# Patient Record
Sex: Male | Born: 2000 | Race: Black or African American | Hispanic: No | Marital: Single | Smoking: Current some day smoker
Health system: Southern US, Community
[De-identification: ages and names within clinical notes are randomized; demographics above are authoritative.]

## PROBLEM LIST (undated history)

## (undated) DIAGNOSIS — K4090 Unilateral inguinal hernia, without obstruction or gangrene, not specified as recurrent: Secondary | ICD-10-CM

## (undated) HISTORY — DX: Unilateral inguinal hernia, without obstruction or gangrene, not specified as recurrent: K40.90

## (undated) HISTORY — PX: NO PAST SURGERIES: SHX2092

## (undated) HISTORY — PX: WISDOM TOOTH EXTRACTION: SHX21

---
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
## 2019-07-15 ENCOUNTER — Ambulatory Visit: Payer: Medicaid Other | Admitting: Adult Health

## 2019-07-15 NOTE — Progress Notes (Deleted)
     Established patient visit   Patient: Travis Love   DOB: [birth year]   19 y.o. Male  MRN: [id number] Visit Date: 07/15/2019  Today's healthcare provider: Jairo Ben, FNP   No chief complaint on file.  Subjective    Exposure to STD This is a new problem.   ***  {Show patient history (optional):23778::" "}   Medications: Outpatient Medications Prior to Visit  Medication Sig  . cetirizine (ZYRTEC) 10 MG tablet Take 10 mg by mouth daily.  Marland Kitchen doxycycline (VIBRA-TABS) 100 MG tablet Take 1 tablet (100 mg total) by mouth 2 (two) times daily.  Marland Kitchen EPIPEN 2-PAK 0.3 MG/0.3ML SOAJ injection use as directed by prescriber  FOR SYSTEMIC REACTION INJECTION 4 DAYS  . fluticasone (FLONASE) 50 MCG/ACT nasal spray 1 spray daily.  Marland Kitchen ibuprofen (ADVIL,MOTRIN) 600 MG tablet Take 1 tablet (600 mg total) by mouth every 8 (eight) hours as needed.  . montelukast (SINGULAIR) 10 MG tablet Take 10 mg by mouth every evening.  Marland Kitchen omeprazole (PRILOSEC) 20 MG capsule Take 20 mg by mouth daily.  Marland Kitchen sulfacetamide (BLEPH-10) 10 % ophthalmic solution Place 2 drops into the left eye 4 (four) times daily.   No facility-administered medications prior to visit.    Review of Systems  {Heme  Chem  Endocrine  Serology  Results Review (optional):23779::" "}  Objective    There were no vitals taken for this visit. {Show previous vital signs (optional):23777::" "}  Physical Exam  ***  No results found for any visits on 07/15/19.  Assessment & Plan     ***  No follow-ups on file.      {provider attestation***:1}   Jairo Ben, FNP  [facility] [phone] (phone) [phone] (fax)  [facility]

## 2019-08-05 ENCOUNTER — Encounter: Payer: Self-pay | Admitting: Physician Assistant

## 2019-08-05 ENCOUNTER — Other Ambulatory Visit: Payer: Self-pay

## 2019-08-05 ENCOUNTER — Ambulatory Visit (INDEPENDENT_AMBULATORY_CARE_PROVIDER_SITE_OTHER): Payer: Medicaid Other | Admitting: Physician Assistant

## 2019-08-05 ENCOUNTER — Other Ambulatory Visit (HOSPITAL_COMMUNITY)
Admission: RE | Admit: 2019-08-05 | Discharge: 2019-08-05 | Disposition: A | Discharge status: Home / Self Care | Payer: Medicaid Other | Source: Ambulatory Visit | Attending: Physician Assistant | Admitting: Physician Assistant

## 2019-08-05 VITALS — BP 116/82 | HR 118 | Temp 97.1°F | Wt 132.4 lb

## 2019-08-05 DIAGNOSIS — Z202 Contact with and (suspected) exposure to infections with a predominantly sexual mode of transmission: Secondary | ICD-10-CM | POA: Insufficient documentation

## 2019-08-05 MED ORDER — AZITHROMYCIN 500 MG PO TABS: 1000.0000 mg | ORAL_TABLET | Freq: Every day | ORAL | 0 refills | Status: DC

## 2019-08-05 MED ORDER — CEFTRIAXONE SODIUM 500 MG IJ SOLR
500.0000 mg | Freq: Once | INTRAMUSCULAR | Status: AC
  Administered 2019-08-05: 500 mg via INTRAMUSCULAR

## 2019-08-05 NOTE — Progress Notes (Signed)
Established patient visit   Patient: Travis Love   DOB: 01/23/2001   19 y.o. Male  MRN: [id number] Visit Date: 08/05/2019  Today's healthcare provider: Trinna Post, Travis Love   Chief Complaint  Patient presents with  . Exposure to STD  Travis Love,Travis Love,Travis Travis a scribe for [employer], Travis Love.,Travis documented all relevant documentation on the behalf of Trinna Post, Travis Love,Travis directed by  Trinna Post, Travis Love while in the presence of Trinna Post, Travis Love.  Subjective    Exposure to STD The patient's pertinent negatives include no genital injury, genital itching, genital lesions, pelvic pain, penile discharge or testicular pain. The patient is experiencing no pain. Pertinent negatives include no discolored urine, hesitancy, nausea, painful intercourse, rash or urinary retention. Nothing aggravates the symptoms. He has tried nothing for the symptoms. The treatment provided no relief. He is sexually active. Yes, his partner has an STD. His past medical history is significant for chlamydia and gonorrhea.  Patient presents today for exposure to sexually transmitted diseases. Patient had sexually act with a male that tested positive for gonorrhea and chlamydia. Patient declines any symptoms at the moment.     Medications: Outpatient Medications Prior to Visit  Medication Sig  . cetirizine (ZYRTEC) 10 MG tablet Take 10 mg by mouth daily. (Patient not taking: Reported on 08/05/2019)  . doxycycline (VIBRA-TABS) 100 MG tablet Take 1 tablet (100 mg total) by mouth 2 (two) times daily. (Patient not taking: Reported on 08/05/2019)  . EPIPEN 2-PAK 0.3 MG/0.3ML SOAJ injection use Travis directed by prescriber  FOR SYSTEMIC REACTION INJECTION 4 DAYS (Patient not taking: Reported on 08/05/2019)  . fluticasone (FLONASE) 50 MCG/ACT nasal spray 1 spray daily. (Patient not taking: Reported on 08/05/2019)  . ibuprofen (ADVIL,MOTRIN) 600 MG tablet Take 1 tablet (600 mg total) by mouth every 8  (eight) hours Travis needed. (Patient not taking: Reported on 08/05/2019)  . montelukast (SINGULAIR) 10 MG tablet Take 10 mg by mouth every evening. (Patient not taking: Reported on 08/05/2019)  . omeprazole (PRILOSEC) 20 MG capsule Take 20 mg by mouth daily. (Patient not taking: Reported on 08/05/2019)  . sulfacetamide (BLEPH-10) 10 % ophthalmic solution Place 2 drops into the left eye 4 (four) times daily. (Patient not taking: Reported on 08/05/2019)   No facility-administered medications prior to visit.    Review of Systems  Gastrointestinal: Negative for nausea.  Genitourinary: Negative for discharge, hesitancy, pelvic pain and testicular pain.  Skin: Negative for rash.      Objective    BP 116/82 (BP Location: Right Arm, Patient Position: Sitting, Cuff Size: Normal)   Pulse (!) 118   Temp (!) 97.1 F (36.2 C) (Temporal)   Wt 132 lb 6.4 oz (60.1 kg)   SpO2 98%   BMI 20.74 kg/m    Physical Exam Constitutional:      Appearance: Normal appearance.  Cardiovascular:     Rate and Rhythm: Normal rate and regular rhythm.     Heart sounds: Normal heart sounds.  Pulmonary:     Effort: Pulmonary effort is normal.     Breath sounds: Normal breath sounds.  Skin:    General: Skin is warm and dry.  Neurological:     Mental Status: He is alert and oriented to person, place, and time. Mental status is at baseline.  Psychiatric:        Mood and Affect: Mood normal.        Behavior: Behavior normal.  No results found for any visits on 08/05/19.  Assessment & Plan    1. Exposure to sexually transmitted disease (STD) Patient sexually active with a male that tested positive for gonorrhea and chlamydia. Treat empirically.   - Urine cytology ancillary only - cefTRIAXone (ROCEPHIN) injection 500 mg - azithromycin (ZITHROMAX) 500 MG tablet; Take 2 tablets (1,000 mg total) by mouth daily.  Dispense: 2 tablet; Refill: 0   Return if symptoms worsen or fail to improve.      ITrey Sailors, Travis Love, Travis reviewed all documentation for this visit. The documentation on 08/05/19 for the exam, diagnosis, procedures, and orders are all accurate and complete.    Maryella Shivers  [facility] [phone] (phone) [phone] (fax)  [facility]

## 2019-08-09 ENCOUNTER — Telehealth: Payer: Self-pay

## 2019-08-09 LAB — URINE CYTOLOGY ANCILLARY ONLY
Chlamydia: POSITIVE — AB
Comment: NEGATIVE
Comment: NEGATIVE
Comment: NORMAL
Neisseria Gonorrhea: POSITIVE — AB
Trichomonas: NEGATIVE

## 2019-08-09 NOTE — Telephone Encounter (Signed)
Patient's mother Travis Love) was advised per patient's request when in office on 08/05/2019. Form also submitted to health department.

## 2019-08-09 NOTE — Telephone Encounter (Signed)
-----   Message from Margaretann Loveless, [place] sent at 08/09/2019 12:38 PM EDT ----- Urine was positive for gonorrhea and chlamydia. You have already been treated for this on 08/05/19. Can come back to the office in 2 weeks and test for cure. Staff, please send form to health department.

## 2019-08-18 NOTE — Progress Notes (Signed)
Established patient visit   Patient: Travis Love   DOB: [birth year]   19 y.o. Male  MRN: [id number] Visit Date: 08/19/2019  Today's healthcare provider: Trey Sailors, PA-C   Chief Complaint  Patient presents with  . Follow-up   Subjective    HPI Follow up for STI  The patient was last seen for this 2 weeks ago. Changes made at last visit include starting azithromycin 500mg  2 tablets in 1 day. He was also given Rocephin 500mg  injection in the office.        He reports good compliance with treatment. He feels that condition is Worse.  He is not having side effects.   Reports his sexual partner has trichomonas. He reports a small lump on his back for 2 weeks. He reports that initially it was painful, but it is not any longer. Denies fevers, chills, and night sweats.        Medications: Outpatient Medications Prior to Visit  Medication Sig  . azithromycin (ZITHROMAX) 500 MG tablet Take 2 tablets (1,000 mg total) by mouth daily. (Patient not taking: Reported on 08/19/2019)  . cetirizine (ZYRTEC) 10 MG tablet Take 10 mg by mouth daily. (Patient not taking: Reported on 08/05/2019)  . doxycycline (VIBRA-TABS) 100 MG tablet Take 1 tablet (100 mg total) by mouth 2 (two) times daily. (Patient not taking: Reported on 08/05/2019)  . EPIPEN 2-PAK 0.3 MG/0.3ML SOAJ injection use as directed by prescriber  FOR SYSTEMIC REACTION INJECTION 4 DAYS (Patient not taking: Reported on 08/05/2019)  . fluticasone (FLONASE) 50 MCG/ACT nasal spray 1 spray daily. (Patient not taking: Reported on 08/05/2019)  . ibuprofen (ADVIL,MOTRIN) 600 MG tablet Take 1 tablet (600 mg total) by mouth every 8 (eight) hours as needed. (Patient not taking: Reported on 08/05/2019)  . montelukast (SINGULAIR) 10 MG tablet Take 10 mg by mouth every evening. (Patient not taking: Reported on 08/05/2019)  . omeprazole (PRILOSEC) 20 MG capsule Take 20 mg by mouth daily. (Patient not taking: Reported on 08/05/2019)  .  sulfacetamide (BLEPH-10) 10 % ophthalmic solution Place 2 drops into the left eye 4 (four) times daily. (Patient not taking: Reported on 08/05/2019)   No facility-administered medications prior to visit.    Review of Systems    Objective    BP 120/72   Pulse 96   Temp 97.8 F (36.6 C)   Resp 16   Wt 138 lb (62.6 kg)   BMI 21.61 kg/m    Physical Exam Constitutional:      Appearance: Normal appearance.  Cardiovascular:     Rate and Rhythm: Normal rate and regular rhythm.     Heart sounds: Normal heart sounds.  Pulmonary:     Effort: Pulmonary effort is normal.     Breath sounds: Normal breath sounds.  Skin:      Neurological:     Mental Status: He is alert and oriented to person, place, and time. Mental status is at baseline.  Psychiatric:        Mood and Affect: Mood normal.       Results for orders placed or performed in visit on 08/19/19  CBC with Differential/Platelet  Result Value Ref Range   WBC 7.5 3.4 - 10.8 x10E3/uL   RBC 4.91 4.14 - 5.80 x10E6/uL   Hemoglobin 14.5 13.0 - 17.7 g/dL   Hematocrit 10/05/2019 08/21/19 - 51.0 %   MCV 88 79 - 97 fL   MCH 29.5 26.6 - 33.0 pg   MCHC 33.5  31 - 35 g/dL   RDW 12.2 11.6 - 15.4 %   Platelets 245 150 - 450 x10E3/uL   Neutrophils 60 Not Estab. %   Lymphs 30 Not Estab. %   Monocytes 8 Not Estab. %   Eos 1 Not Estab. %   Basos 1 Not Estab. %   Neutrophils Absolute 4.5 1 - 7 x10E3/uL   Lymphocytes Absolute 2.3 0 - 3 x10E3/uL   Monocytes Absolute 0.6 0 - 0 x10E3/uL   EOS (ABSOLUTE) 0.1 0.0 - 0.4 x10E3/uL   Basophils Absolute 0.0 0 - 0 x10E3/uL   Immature Granulocytes 0 Not Estab. %   Immature Grans (Abs) 0.0 0.0 - 0.1 x10E3/uL  Comprehensive metabolic panel  Result Value Ref Range   Glucose 67 65 - 99 mg/dL   BUN 9 6 - 20 mg/dL   Creatinine, Ser 1.01 0.76 - 1.27 mg/dL   GFR calc non Af Amer 107 >59 mL/min/1.73   GFR calc Af Amer 124 >59 mL/min/1.73   BUN/Creatinine Ratio 9 9 - 20   Sodium 139 134 - 144 mmol/L    Potassium 4.2 3.5 - 5.2 mmol/L   Chloride 100 96 - 106 mmol/L   CO2 26 20 - 29 mmol/L   Calcium 9.4 8.7 - 10.2 mg/dL   Total Protein 7.5 6.0 - 8.5 g/dL   Albumin 4.7 4.1 - 5.2 g/dL   Globulin, Total 2.8 1.5 - 4.5 g/dL   Albumin/Globulin Ratio 1.7 1.2 - 2.2   Bilirubin Total 0.3 0.0 - 1.2 mg/dL   Alkaline Phosphatase 118 55 - 125 IU/L   AST 18 0 - 40 IU/L   ALT 7 0 - 44 IU/L  HIV antibody (with reflex)  Result Value Ref Range   HIV Screen 4th Generation wRfx Non Reactive Non Reactive    Assessment & Plan    1. Exposure to sexually transmitted disease (STD) Recheck after treatment.  - HIV antibody (with reflex) - Urine cytology ancillary only  2. Abdominal mass, unspecified abdominal location Xray as below. If xray is negative, will get ultrasound.   Ordering ultrasound, xray normal. Ultrasound order placed.   - DG Ribs Unilateral Right; Future - CBC with Differential/Platelet - Comprehensive metabolic panel   Return if symptoms worsen or fail to improve.      ITrinna Post, PA-C, have reviewed all documentation for this visit. The documentation on 08/23/19 for the exam, diagnosis, procedures, and orders are all accurate and complete.    Paulene Floor  [facility] [phone] (phone) [phone] (fax)  [place]

## 2019-08-19 ENCOUNTER — Encounter: Payer: Self-pay | Admitting: Physician Assistant

## 2019-08-19 ENCOUNTER — Ambulatory Visit (INDEPENDENT_AMBULATORY_CARE_PROVIDER_SITE_OTHER): Payer: Medicaid Other | Admitting: Physician Assistant

## 2019-08-19 ENCOUNTER — Ambulatory Visit
Admission: RE | Admit: 2019-08-19 | Discharge: 2019-08-19 | Disposition: A | Discharge status: Home / Self Care | Payer: Medicaid Other | Attending: Physician Assistant | Admitting: Physician Assistant

## 2019-08-19 ENCOUNTER — Other Ambulatory Visit (HOSPITAL_COMMUNITY)
Admission: RE | Admit: 2019-08-19 | Discharge: 2019-08-19 | Disposition: A | Discharge status: Home / Self Care | Payer: Medicaid Other | Source: Ambulatory Visit | Attending: Physician Assistant | Admitting: Physician Assistant

## 2019-08-19 ENCOUNTER — Ambulatory Visit
Admission: RE | Admit: 2019-08-19 | Discharge: 2019-08-19 | Disposition: A | Discharge status: Home / Self Care | Payer: Medicaid Other | Source: Ambulatory Visit | Attending: Physician Assistant | Admitting: Physician Assistant

## 2019-08-19 ENCOUNTER — Other Ambulatory Visit: Payer: Self-pay

## 2019-08-19 VITALS — BP 120/72 | HR 96 | Temp 97.8°F | Resp 16 | Wt 138.0 lb

## 2019-08-19 DIAGNOSIS — Z202 Contact with and (suspected) exposure to infections with a predominantly sexual mode of transmission: Secondary | ICD-10-CM | POA: Insufficient documentation

## 2019-08-19 DIAGNOSIS — R0781 Pleurodynia: Secondary | ICD-10-CM | POA: Insufficient documentation

## 2019-08-19 DIAGNOSIS — R222 Localized swelling, mass and lump, trunk: Secondary | ICD-10-CM | POA: Diagnosis not present

## 2019-08-19 DIAGNOSIS — R1900 Intra-abdominal and pelvic swelling, mass and lump, unspecified site: Secondary | ICD-10-CM | POA: Insufficient documentation

## 2019-08-20 LAB — COMPREHENSIVE METABOLIC PANEL
ALT: 7 IU/L (ref 0–44)
AST: 18 IU/L (ref 0–40)
Albumin/Globulin Ratio: 1.7 (ref 1.2–2.2)
Albumin: 4.7 g/dL (ref 4.1–5.2)
Alkaline Phosphatase: 118 IU/L (ref 55–125)
BUN/Creatinine Ratio: 9 (ref 9–20)
BUN: 9 mg/dL (ref 6–20)
Bilirubin Total: 0.3 mg/dL (ref 0.0–1.2)
CO2: 26 mmol/L (ref 20–29)
Calcium: 9.4 mg/dL (ref 8.7–10.2)
Chloride: 100 mmol/L (ref 96–106)
Creatinine, Ser: 1.01 mg/dL (ref 0.76–1.27)
GFR calc Af Amer: 124 mL/min/{1.73_m2} (ref >59)
GFR calc non Af Amer: 107 mL/min/{1.73_m2} (ref >59)
Globulin, Total: 2.8 g/dL (ref 1.5–4.5)
Glucose: 67 mg/dL (ref 65–99)
Potassium: 4.2 mmol/L (ref 3.5–5.2)
Sodium: 139 mmol/L (ref 134–144)
Total Protein: 7.5 g/dL (ref 6.0–8.5)

## 2019-08-20 LAB — CBC WITH DIFFERENTIAL/PLATELET
Basophils Absolute: 0 10*3/uL (ref 0.0–0.2)
Basos: 1 %
EOS (ABSOLUTE): 0.1 10*3/uL (ref 0.0–0.4)
Eos: 1 %
Hematocrit: 43.3 % (ref 37.5–51.0)
Hemoglobin: 14.5 g/dL (ref 13.0–17.7)
Immature Grans (Abs): 0 10*3/uL (ref 0.0–0.1)
Immature Granulocytes: 0 %
Lymphocytes Absolute: 2.3 10*3/uL (ref 0.7–3.1)
Lymphs: 30 %
MCH: 29.5 pg (ref 26.6–33.0)
MCHC: 33.5 g/dL (ref 31.5–35.7)
MCV: 88 fL (ref 79–97)
Monocytes Absolute: 0.6 10*3/uL (ref 0.1–0.9)
Monocytes: 8 %
Neutrophils Absolute: 4.5 10*3/uL (ref 1.4–7.0)
Neutrophils: 60 %
Platelets: 245 10*3/uL (ref 150–450)
RBC: 4.91 x10E6/uL (ref 4.14–5.80)
RDW: 12.2 % (ref 11.6–15.4)
WBC: 7.5 10*3/uL (ref 3.4–10.8)

## 2019-08-20 LAB — HIV ANTIBODY (ROUTINE TESTING W REFLEX): HIV Screen 4th Generation wRfx: NONREACTIVE

## 2019-08-22 ENCOUNTER — Telehealth: Payer: Self-pay

## 2019-08-22 NOTE — Telephone Encounter (Signed)
Patients mother has been advised, please place in order for ultrasound. KW

## 2019-08-22 NOTE — Telephone Encounter (Signed)
-----   Message from Trey Sailors, [place] sent at 08/22/2019  1:48 PM EDT ----- Lump does not appear on xray. Is it OK to proceed with ultrasound? OK to talk to mom.

## 2019-08-23 LAB — URINE CYTOLOGY ANCILLARY ONLY
Chlamydia: NEGATIVE
Comment: NEGATIVE
Comment: NEGATIVE
Comment: NORMAL
Neisseria Gonorrhea: NEGATIVE
Trichomonas: NEGATIVE

## 2019-08-24 ENCOUNTER — Telehealth: Payer: Self-pay | Admitting: Physician Assistant

## 2019-08-24 ENCOUNTER — Other Ambulatory Visit: Payer: Self-pay | Admitting: Internal Medicine

## 2019-08-24 DIAGNOSIS — R1900 Intra-abdominal and pelvic swelling, mass and lump, unspecified site: Secondary | ICD-10-CM

## 2019-08-24 NOTE — Telephone Encounter (Signed)
Please review. Thanks!  

## 2019-08-24 NOTE — Telephone Encounter (Signed)
Order placed on 08/23/2019.

## 2019-08-24 NOTE — Telephone Encounter (Signed)
Per [facility] before biopsy can be done they will need an order for ultrasound chest soft tissue [id number]

## 2019-08-24 NOTE — Telephone Encounter (Signed)
They also ask that you put in all notes that you had in biopsy order

## 2019-08-24 NOTE — Telephone Encounter (Signed)
Can we check with sarah or the imaging center to see if there are recommendations to ultrasound his back where the lesion is? I don't want a biopsy but that was the only ultrasound that covered the area in question. The lesion is on his right posterior torso around the 10th rib.

## 2019-08-26 ENCOUNTER — Telehealth: Payer: Self-pay

## 2019-08-26 NOTE — Addendum Note (Signed)
Addended by: Trey Sailors on: 08/26/2019 09:35 AM   Modules accepted: Orders

## 2019-08-26 NOTE — Telephone Encounter (Signed)
Copied from CRM [id number]. Topic: General - Other >> Aug 26, 2019  4:12 PM Gwenlyn Fudge wrote: Reason for CRM: Pts mother returning call and is requesting a call back directly. Please advise.

## 2019-08-26 NOTE — Telephone Encounter (Signed)
Called ultrasound dept and spoke to Travis Love.  She said that if the area is above the bellybutton on his back we would need a [place] chest (even though its more towards the back). If it is below the bellybutton, it would be US abdomen limited. She says to copy and paste the description that is in the previous order.

## 2019-08-26 NOTE — Telephone Encounter (Signed)
New ultrasound ordered

## 2019-09-01 ENCOUNTER — Telehealth: Payer: Self-pay

## 2019-09-01 ENCOUNTER — Ambulatory Visit: Payer: Medicaid Other | Attending: Physician Assistant

## 2019-09-01 NOTE — Telephone Encounter (Signed)
Copied from CRM [id number]. Topic: General - Inquiry >> Sep 01, 2019 10:31 AM Leafy Ro wrote: Reason for CRM: stephanie with [facility] outpt imaging is calling to let adriana know the pt did not show up for his ultrasound today

## 2020-08-31 DIAGNOSIS — F602 Antisocial personality disorder: Secondary | ICD-10-CM | POA: Diagnosis not present

## 2020-08-31 DIAGNOSIS — Z720 Tobacco use: Secondary | ICD-10-CM | POA: Diagnosis not present

## 2020-09-07 DIAGNOSIS — Z720 Tobacco use: Secondary | ICD-10-CM | POA: Diagnosis not present

## 2020-09-07 DIAGNOSIS — F602 Antisocial personality disorder: Secondary | ICD-10-CM | POA: Diagnosis not present

## 2020-09-21 DIAGNOSIS — Z720 Tobacco use: Secondary | ICD-10-CM | POA: Diagnosis not present

## 2020-09-21 DIAGNOSIS — F602 Antisocial personality disorder: Secondary | ICD-10-CM | POA: Diagnosis not present

## 2020-10-03 DIAGNOSIS — F602 Antisocial personality disorder: Secondary | ICD-10-CM | POA: Diagnosis not present

## 2020-10-03 DIAGNOSIS — Z720 Tobacco use: Secondary | ICD-10-CM | POA: Diagnosis not present

## 2020-10-04 DIAGNOSIS — Z720 Tobacco use: Secondary | ICD-10-CM | POA: Diagnosis not present

## 2020-10-04 DIAGNOSIS — F602 Antisocial personality disorder: Secondary | ICD-10-CM | POA: Diagnosis not present

## 2020-10-08 DIAGNOSIS — F602 Antisocial personality disorder: Secondary | ICD-10-CM | POA: Diagnosis not present

## 2020-10-08 DIAGNOSIS — Z720 Tobacco use: Secondary | ICD-10-CM | POA: Diagnosis not present

## 2020-10-10 DIAGNOSIS — Z720 Tobacco use: Secondary | ICD-10-CM | POA: Diagnosis not present

## 2020-10-10 DIAGNOSIS — F602 Antisocial personality disorder: Secondary | ICD-10-CM | POA: Diagnosis not present

## 2020-10-15 DIAGNOSIS — Z720 Tobacco use: Secondary | ICD-10-CM | POA: Diagnosis not present

## 2020-10-15 DIAGNOSIS — F602 Antisocial personality disorder: Secondary | ICD-10-CM | POA: Diagnosis not present

## 2020-10-19 DIAGNOSIS — F602 Antisocial personality disorder: Secondary | ICD-10-CM | POA: Diagnosis not present

## 2020-10-19 DIAGNOSIS — Z720 Tobacco use: Secondary | ICD-10-CM | POA: Diagnosis not present

## 2020-10-23 DIAGNOSIS — Z720 Tobacco use: Secondary | ICD-10-CM | POA: Diagnosis not present

## 2020-10-23 DIAGNOSIS — F602 Antisocial personality disorder: Secondary | ICD-10-CM | POA: Diagnosis not present

## 2020-11-06 DIAGNOSIS — F602 Antisocial personality disorder: Secondary | ICD-10-CM | POA: Diagnosis not present

## 2020-11-06 DIAGNOSIS — Z720 Tobacco use: Secondary | ICD-10-CM | POA: Diagnosis not present

## 2020-11-06 DIAGNOSIS — F1299 Cannabis use, unspecified with unspecified cannabis-induced disorder: Secondary | ICD-10-CM | POA: Diagnosis not present

## 2020-11-15 DIAGNOSIS — F602 Antisocial personality disorder: Secondary | ICD-10-CM | POA: Diagnosis not present

## 2020-11-15 DIAGNOSIS — F1299 Cannabis use, unspecified with unspecified cannabis-induced disorder: Secondary | ICD-10-CM | POA: Diagnosis not present

## 2020-11-15 DIAGNOSIS — Z720 Tobacco use: Secondary | ICD-10-CM | POA: Diagnosis not present

## 2020-11-21 DIAGNOSIS — F1299 Cannabis use, unspecified with unspecified cannabis-induced disorder: Secondary | ICD-10-CM | POA: Diagnosis not present

## 2020-11-21 DIAGNOSIS — Z720 Tobacco use: Secondary | ICD-10-CM | POA: Diagnosis not present

## 2020-11-21 DIAGNOSIS — F602 Antisocial personality disorder: Secondary | ICD-10-CM | POA: Diagnosis not present

## 2020-11-26 DIAGNOSIS — F1299 Cannabis use, unspecified with unspecified cannabis-induced disorder: Secondary | ICD-10-CM | POA: Diagnosis not present

## 2020-11-26 DIAGNOSIS — F602 Antisocial personality disorder: Secondary | ICD-10-CM | POA: Diagnosis not present

## 2020-11-26 DIAGNOSIS — Z720 Tobacco use: Secondary | ICD-10-CM | POA: Diagnosis not present

## 2020-11-27 ENCOUNTER — Ambulatory Visit
Admission: EM | Admit: 2020-11-27 | Discharge: 2020-11-27 | Disposition: A | Discharge status: Home / Self Care | Payer: Medicaid Other | Attending: Emergency Medicine | Admitting: Emergency Medicine

## 2020-11-27 ENCOUNTER — Other Ambulatory Visit: Payer: Self-pay

## 2020-11-27 ENCOUNTER — Encounter: Payer: Self-pay | Admitting: Emergency Medicine

## 2020-11-27 ENCOUNTER — Ambulatory Visit: Payer: Self-pay | Admitting: *Deleted

## 2020-11-27 DIAGNOSIS — M25552 Pain in left hip: Secondary | ICD-10-CM | POA: Diagnosis not present

## 2020-11-27 MED ORDER — IBUPROFEN 600 MG PO TABS: 600.0000 mg | ORAL_TABLET | Freq: Four times a day (QID) | ORAL | 0 refills | Status: DC | PRN
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
## 2020-11-27 NOTE — Telephone Encounter (Signed)
Returned call to [place]. Advised UC due to no availability. Expressed understanding.
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
## 2020-11-27 NOTE — Discharge Instructions (Addendum)
Take the ibuprofen as needed for discomfort.    Follow up with your primary care provider if your symptoms are not improving.

## 2020-12-05 DIAGNOSIS — F1299 Cannabis use, unspecified with unspecified cannabis-induced disorder: Secondary | ICD-10-CM | POA: Diagnosis not present

## 2020-12-05 DIAGNOSIS — Z720 Tobacco use: Secondary | ICD-10-CM | POA: Diagnosis not present

## 2020-12-05 DIAGNOSIS — F602 Antisocial personality disorder: Secondary | ICD-10-CM | POA: Diagnosis not present

## 2020-12-06 DIAGNOSIS — F1299 Cannabis use, unspecified with unspecified cannabis-induced disorder: Secondary | ICD-10-CM | POA: Diagnosis not present

## 2020-12-06 DIAGNOSIS — Z720 Tobacco use: Secondary | ICD-10-CM | POA: Diagnosis not present

## 2020-12-06 DIAGNOSIS — F602 Antisocial personality disorder: Secondary | ICD-10-CM | POA: Diagnosis not present

## 2020-12-10 DIAGNOSIS — F602 Antisocial personality disorder: Secondary | ICD-10-CM | POA: Diagnosis not present

## 2020-12-10 DIAGNOSIS — F1299 Cannabis use, unspecified with unspecified cannabis-induced disorder: Secondary | ICD-10-CM | POA: Diagnosis not present

## 2020-12-10 DIAGNOSIS — Z720 Tobacco use: Secondary | ICD-10-CM | POA: Diagnosis not present

## 2020-12-17 DIAGNOSIS — Z720 Tobacco use: Secondary | ICD-10-CM | POA: Diagnosis not present

## 2020-12-17 DIAGNOSIS — F1299 Cannabis use, unspecified with unspecified cannabis-induced disorder: Secondary | ICD-10-CM | POA: Diagnosis not present

## 2020-12-17 DIAGNOSIS — F602 Antisocial personality disorder: Secondary | ICD-10-CM | POA: Diagnosis not present

## 2020-12-18 DIAGNOSIS — Z720 Tobacco use: Secondary | ICD-10-CM | POA: Diagnosis not present

## 2020-12-18 DIAGNOSIS — F1299 Cannabis use, unspecified with unspecified cannabis-induced disorder: Secondary | ICD-10-CM | POA: Diagnosis not present

## 2020-12-18 DIAGNOSIS — F602 Antisocial personality disorder: Secondary | ICD-10-CM | POA: Diagnosis not present

## 2020-12-20 DIAGNOSIS — Z720 Tobacco use: Secondary | ICD-10-CM | POA: Diagnosis not present

## 2020-12-20 DIAGNOSIS — F1299 Cannabis use, unspecified with unspecified cannabis-induced disorder: Secondary | ICD-10-CM | POA: Diagnosis not present

## 2020-12-20 DIAGNOSIS — F602 Antisocial personality disorder: Secondary | ICD-10-CM | POA: Diagnosis not present

## 2020-12-25 DIAGNOSIS — Z720 Tobacco use: Secondary | ICD-10-CM | POA: Diagnosis not present

## 2020-12-25 DIAGNOSIS — F1299 Cannabis use, unspecified with unspecified cannabis-induced disorder: Secondary | ICD-10-CM | POA: Diagnosis not present

## 2020-12-25 DIAGNOSIS — F602 Antisocial personality disorder: Secondary | ICD-10-CM | POA: Diagnosis not present

## 2020-12-27 DIAGNOSIS — Z720 Tobacco use: Secondary | ICD-10-CM | POA: Diagnosis not present

## 2020-12-27 DIAGNOSIS — F602 Antisocial personality disorder: Secondary | ICD-10-CM | POA: Diagnosis not present

## 2020-12-27 DIAGNOSIS — F1299 Cannabis use, unspecified with unspecified cannabis-induced disorder: Secondary | ICD-10-CM | POA: Diagnosis not present

## 2020-12-31 DIAGNOSIS — F1299 Cannabis use, unspecified with unspecified cannabis-induced disorder: Secondary | ICD-10-CM | POA: Diagnosis not present

## 2020-12-31 DIAGNOSIS — Z720 Tobacco use: Secondary | ICD-10-CM | POA: Diagnosis not present

## 2020-12-31 DIAGNOSIS — F602 Antisocial personality disorder: Secondary | ICD-10-CM | POA: Diagnosis not present

## 2021-01-01 DIAGNOSIS — F1299 Cannabis use, unspecified with unspecified cannabis-induced disorder: Secondary | ICD-10-CM | POA: Diagnosis not present

## 2021-01-01 DIAGNOSIS — Z720 Tobacco use: Secondary | ICD-10-CM | POA: Diagnosis not present

## 2021-01-01 DIAGNOSIS — F602 Antisocial personality disorder: Secondary | ICD-10-CM | POA: Diagnosis not present

## 2021-01-04 DIAGNOSIS — Z720 Tobacco use: Secondary | ICD-10-CM | POA: Diagnosis not present

## 2021-01-04 DIAGNOSIS — F1299 Cannabis use, unspecified with unspecified cannabis-induced disorder: Secondary | ICD-10-CM | POA: Diagnosis not present

## 2021-01-04 DIAGNOSIS — F602 Antisocial personality disorder: Secondary | ICD-10-CM | POA: Diagnosis not present

## 2021-01-08 DIAGNOSIS — F602 Antisocial personality disorder: Secondary | ICD-10-CM | POA: Diagnosis not present

## 2021-01-08 DIAGNOSIS — Z720 Tobacco use: Secondary | ICD-10-CM | POA: Diagnosis not present

## 2021-01-08 DIAGNOSIS — F1299 Cannabis use, unspecified with unspecified cannabis-induced disorder: Secondary | ICD-10-CM | POA: Diagnosis not present

## 2021-01-15 ENCOUNTER — Ambulatory Visit: Admission: EM | Admit: 2021-01-15 | Discharge: 2021-01-15 | Payer: Medicaid Other

## 2021-01-15 ENCOUNTER — Ambulatory Visit: Payer: Self-pay

## 2021-01-15 ENCOUNTER — Other Ambulatory Visit: Payer: Self-pay

## 2021-01-15 ENCOUNTER — Emergency Department
Admission: EM | Admit: 2021-01-15 | Discharge: 2021-01-15 | Disposition: A | Discharge status: Home / Self Care | Payer: Medicaid Other | Attending: Emergency Medicine | Admitting: Emergency Medicine

## 2021-01-15 ENCOUNTER — Encounter: Payer: Self-pay | Admitting: Emergency Medicine

## 2021-01-15 ENCOUNTER — Emergency Department: Payer: Medicaid Other

## 2021-01-15 DIAGNOSIS — I861 Scrotal varices: Secondary | ICD-10-CM | POA: Diagnosis not present

## 2021-01-15 DIAGNOSIS — F17210 Nicotine dependence, cigarettes, uncomplicated: Secondary | ICD-10-CM | POA: Insufficient documentation

## 2021-01-15 DIAGNOSIS — N50819 Testicular pain, unspecified: Secondary | ICD-10-CM | POA: Diagnosis not present

## 2021-01-15 DIAGNOSIS — N503 Cyst of epididymis: Secondary | ICD-10-CM | POA: Diagnosis not present

## 2021-01-15 DIAGNOSIS — N50812 Left testicular pain: Secondary | ICD-10-CM | POA: Diagnosis present

## 2021-01-15 NOTE — Telephone Encounter (Signed)
Pt's mother called in stating that pt is having L testicle pain that's constant. Has been going on for a year but comes and goes. Also has a rash around his penis but mother is unsure if its still there or not. Doesn't feel that its STD because only had 1 sexual partner and just had a baby. Mother is asking for an appt today. Advised earliest appt was tomorrow at 1340 with Lillia Abed. Advised she can take him to UC or ER. Mother says she will take him to UC because she is concerned. Care advice given and pt verbalized understanding. No other questions/concerns noted.      Reason for Disposition  [1] MILD pain in penis, scrotum, or testicle AND [2] lasts 2 hours  Answer Assessment - Initial Assessment Questions 1. MECHANISM: "How did the injury happen?" (Injuries to the penis can occur during sexual intercourse or masturbation; the caller may not be willing to mention this)      L testicle  2. ONSET: "When did the injury happen?" (Minutes or hours ago)      Come and go for a year but now got worse 3. LOCATION: "What part of the genitals are injured?"      L testicle  4. APPEARANCE of INJURY: "What does the injury look like?"      NA 5. BLEEDING: "Is the injury still bleeding?" If Yes, ask: "Is it difficult to stop?"      NA 6. SIZE: For cuts, bruises, or swelling, ask: "How large is it?" (e.g., inches or centimeters)      NA 7. PAIN: "Is it painful?" If Yes, ask: "How bad is the pain?"    (e.g., Scale 1-10; or mild, moderate, severe)     *No Answer* 8. TETANUS: For any breaks in the skin, ask: "When was the last tetanus booster?"     *No Answer* 9. OTHER SYMPTOMS: "Do you have any other symptoms?" (e.g., blood from tip of penis, bloody urine)     Rash around penis  Protocols used: Genital Injury - Male-A-AH

## 2021-01-15 NOTE — ED Triage Notes (Addendum)
Pt comes into the ED via POV c/o left side groin pain and genital rash.  Pt states he has had the same sexual partner for a while and he isnt concerned with any STD's.  Pt does explain that the groin pain is worse with movement.  He denies any urinary problems at this time.  Denies any bulge in the area.  Pt ambulatory to triage and in NAD with even and unlabored respirations and steady gait.  Pt also denies any testicular swelling. Pt states this has been an ongoing problem for about a year.

## 2021-01-15 NOTE — ED Notes (Signed)
Pt was referred to ER due to his symptoms. He has had left testicular pain x 1 year. We advised patient that he could be seen but he may need an U/S to verify the cause of the pain. Offered to provide STD testing pt declined and stated he would go to the ER

## 2021-01-15 NOTE — ED Provider Notes (Signed)
[facility] Emergency Department Provider Note  ____________________________________________  Time seen: Approximately 9:21 PM  I have reviewed the triage vital signs and the nursing notes.   HISTORY  Chief Complaint Groin Pain    HPI Travis Love is a 20 y.o. male who presents the emergency department complaining of left-sided testicular pain.  Patient states that he has had symptoms for roughly a year.  No dysuria, polyuria, hematuria.  Patient went to urgent care today and was referred to the emergency department for ultrasound.  Patient denies any penile discharge.  No rash in the genitals.  No other complaints to include fevers or chills, nausea, vomiting, diarrhea or constipation.       History reviewed. No pertinent past medical history.  Patient Active Problem List   Diagnosis Date Noted   GERD (gastroesophageal reflux disease) 01/08/2015   Allergic rhinitis 01/08/2015   Hx of anaphylaxis 01/08/2015    Past Surgical History:  Procedure Laterality Date   NO PAST SURGERIES     WISDOM TOOTH EXTRACTION      Prior to Admission medications   Medication Sig Start Date End Date Taking? Authorizing Provider  azithromycin (ZITHROMAX) 500 MG tablet Take 2 tablets (1,000 mg total) by mouth daily. Patient not taking: Reported on 08/19/2019 08/05/19   Trey Sailors, PA-C  cetirizine (ZYRTEC) 10 MG tablet Take 10 mg by mouth daily. Patient not taking: Reported on 08/05/2019 09/26/14   [provider]  doxycycline (VIBRA-TABS) 100 MG tablet Take 1 tablet (100 mg total) by mouth 2 (two) times daily. Patient not taking: Reported on 08/05/2019 08/20/17   Anola Gurney, PA  EPIPEN 2-PAK 0.3 MG/0.3ML SOAJ injection use as directed by prescriber  FOR SYSTEMIC REACTION INJECTION 4 DAYS Patient not taking: Reported on 08/05/2019 07/11/14   [provider]  fluticasone (FLONASE) 50 MCG/ACT nasal spray 1 spray daily. Patient not taking: Reported  on 08/05/2019 09/26/14   [provider]  ibuprofen (ADVIL) 600 MG tablet Take 1 tablet (600 mg total) by mouth every 6 (six) hours as needed. 11/27/20   Mickie Bail, NP  montelukast (SINGULAIR) 10 MG tablet Take 10 mg by mouth every evening. Patient not taking: Reported on 08/05/2019 09/26/14   [provider]  omeprazole (PRILOSEC) 20 MG capsule Take 20 mg by mouth daily. Patient not taking: Reported on 08/05/2019 09/26/14   [provider]  sulfacetamide (BLEPH-10) 10 % ophthalmic solution Place 2 drops into the left eye 4 (four) times daily. Patient not taking: Reported on 08/05/2019 08/20/17   Anola Gurney, PA    Allergies Dust mite extract, Johnson grass, Other, Pollen extract, and Shellfish allergy  Family History  Problem Relation Age of Onset   Headache Mother    Healthy Father    Healthy Sister    Hyperlipidemia Paternal Grandmother    CVA Maternal Grandfather    Diabetes Maternal Grandfather     Social History Social History   Tobacco Use   Smoking status: Some Days    Packs/day: 0.50    Types: Cigarettes   Smokeless tobacco: Never  Vaping Use   Vaping Use: Never used  Substance Use Topics   Alcohol use: Yes    Alcohol/week: 0.0 standard drinks   Drug use: Yes    Types: Marijuana     Review of Systems  Constitutional: No fever/chills Eyes: No visual changes. No discharge ENT: No upper respiratory complaints. Cardiovascular: no chest pain. Respiratory: no cough. No SOB. Gastrointestinal: No abdominal  pain.  No nausea, no vomiting.  No diarrhea.  No constipation. Genitourinary: Negative for dysuria. No hematuria.  Left-sided testicular pain Musculoskeletal: Negative for musculoskeletal pain. Skin: Negative for rash, abrasions, lacerations, ecchymosis. Neurological: Negative for headaches, focal weakness or numbness.  10 System ROS otherwise negative.  ____________________________________________   PHYSICAL EXAM:  VITAL  SIGNS: ED Triage Vitals [01/15/21 1747]  Enc Vitals Group     BP 117/60     Pulse Rate 95     Resp 16     Temp 98 F (36.7 C)     Temp Source Oral     SpO2 97 %     Weight 138 lb 0.1 oz (62.6 kg)     Height 5\' 7"  (1.702 m)     Head Circumference      Peak Flow      Pain Score 7     Pain Loc      Pain Edu?      Excl. in [place]?      Constitutional: Alert and oriented. Well appearing and in no acute distress. Eyes: Conjunctivae are normal. PERRL. EOMI. Head: Atraumatic. ENT:      Ears:       Nose: No congestion/rhinnorhea.      Mouth/Throat: Mucous membranes are moist.  Neck: No stridor.    Cardiovascular: Normal rate, regular rhythm. Normal S1 and S2.  Good peripheral circulation. Respiratory: Normal respiratory effort without tachypnea or retractions. Lungs CTAB. Good air entry to the bases with no decreased or absent breath sounds. Gastrointestinal: Bowel sounds 4 quadrants. Soft and nontender to palpation. No guarding or rigidity. No palpable masses. No distention. No CVA tenderness. Genitourinary: No rash, lesions.  Slightly tender in the left testicle without palpable abnormality Musculoskeletal: Full range of motion to all extremities. No gross deformities appreciated. Neurologic:  Normal speech and language. No gross focal neurologic deficits are appreciated.  Skin:  Skin is warm, dry and intact. No rash noted. Psychiatric: Mood and affect are normal. Speech and behavior are normal. Patient exhibits appropriate insight and judgement.   ____________________________________________   LABS (all labs ordered are listed, but only abnormal results are displayed)  Labs Reviewed  CHLAMYDIA/NGC RT PCR ([facility] ONLY)            URINALYSIS, ROUTINE W REFLEX MICROSCOPIC   ____________________________________________  EKG   ____________________________________________  RADIOLOGY I personally viewed and evaluated these images as part of my medical decision making, as well as  reviewing the written report by the radiologist.  ED Provider Interpretation: Left-sided varicocele  US SCROTUM W/DOPPLER  Result Date: 01/15/2021 CLINICAL DATA:  Left-sided testicular pain intermittently for 1 year, initial encounter EXAM: SCROTAL ULTRASOUND DOPPLER ULTRASOUND OF THE TESTICLES TECHNIQUE: Complete ultrasound examination of the testicles, epididymis, and other scrotal structures was performed. Color and spectral Doppler ultrasound were also utilized to evaluate blood flow to the testicles. COMPARISON:  None. FINDINGS: Right testicle Measurements: 4.7 x 2.2 x 2.8 cm. No mass or microlithiasis visualized. Left testicle Measurements: 3.9 x 2.0 x 2.5 cm. No mass or microlithiasis visualized. Right epididymis:  Normal in size and appearance. Left epididymis:  5 mm left epididymal cyst is noted. Hydrocele:  None visualized. Varicocele:  Left-sided varicocele is seen. Pulsed Doppler interrogation of both testes demonstrates normal low resistance arterial and venous waveforms bilaterally. IMPRESSION: Normal-appearing testicles. Small left epididymal cyst. Left-sided varicocele likely the etiology of the patient's discomfort. Electronically Signed   By: Inez Catalina M.D.   On: 01/15/2021 22:34  ____________________________________________    PROCEDURES  Procedure(s) performed:    Procedures    Medications - No data to display   ____________________________________________   INITIAL IMPRESSION / ASSESSMENT AND PLAN / ED COURSE  Pertinent labs & imaging results that were available during my care of the patient were reviewed by me and considered in my medical decision making (see chart for details).  Review of the [place] CSRS was performed in accordance of the [place] prior to dispensing any controlled drugs.           Patient's diagnosis is consistent with varicocele.  Patient presented to the emergency department left testicular pain x1 year.  No discharge, dysuria.  No  lesions.  Ultrasound revealed findings consistent with varicocele.  He will follow-up with urology for further management..  Patient is given ED precautions to return to the ED for any worsening or new symptoms.     ____________________________________________  FINAL CLINICAL IMPRESSION(S) / ED DIAGNOSES  Final diagnoses:  Testicle pain  Varicocele      NEW MEDICATIONS STARTED DURING THIS VISIT:  ED Discharge Orders     None           This chart was dictated using voice recognition software/Dragon. Despite best efforts to proofread, errors can occur which can change the meaning. Any change was purely unintentional.    Darletta Moll, PA-C 01/15/21 2248    Duffy Bruce, MD 01/16/21 0140

## 2021-01-16 DIAGNOSIS — F602 Antisocial personality disorder: Secondary | ICD-10-CM | POA: Diagnosis not present

## 2021-01-16 DIAGNOSIS — Z720 Tobacco use: Secondary | ICD-10-CM | POA: Diagnosis not present

## 2021-01-16 DIAGNOSIS — F1299 Cannabis use, unspecified with unspecified cannabis-induced disorder: Secondary | ICD-10-CM | POA: Diagnosis not present

## 2021-01-22 DIAGNOSIS — F602 Antisocial personality disorder: Secondary | ICD-10-CM | POA: Diagnosis not present

## 2021-01-22 DIAGNOSIS — Z720 Tobacco use: Secondary | ICD-10-CM | POA: Diagnosis not present

## 2021-01-22 DIAGNOSIS — F1299 Cannabis use, unspecified with unspecified cannabis-induced disorder: Secondary | ICD-10-CM | POA: Diagnosis not present

## 2021-01-23 ENCOUNTER — Telehealth (INDEPENDENT_AMBULATORY_CARE_PROVIDER_SITE_OTHER): Payer: Medicaid Other | Admitting: Family Medicine

## 2021-01-23 DIAGNOSIS — J069 Acute upper respiratory infection, unspecified: Secondary | ICD-10-CM | POA: Diagnosis not present

## 2021-01-23 DIAGNOSIS — R051 Acute cough: Secondary | ICD-10-CM | POA: Diagnosis not present

## 2021-01-23 MED ORDER — HYDROCODONE BIT-HOMATROP MBR 5-1.5 MG/5ML PO SOLN: 5.0000 mL | Freq: Three times a day (TID) | ORAL | 0 refills | Status: DC | PRN

## 2021-01-23 MED ORDER — AZITHROMYCIN 250 MG PO TABS: ORAL_TABLET | ORAL | 0 refills | Status: AC

## 2021-01-23 NOTE — Progress Notes (Signed)
MyChart Video Visit    Virtual Visit via Video Note   This visit type was conducted due to national recommendations for restrictions regarding the COVID-19 Pandemic (e.g. social distancing) in an effort to limit this patient's exposure and mitigate transmission in our community. This patient is at least at moderate risk for complications without adequate follow up. This format is felt to be most appropriate for this patient at this time. Physical exam was limited by quality of the video and audio technology used for the visit.   Patient location: home Provider location: [facility]  I discussed the limitations of evaluation and management by telemedicine and the availability of in person appointments. The patient expressed understanding and agreed to proceed.  Patient: Travis Love   DOB: Nov 24, 2000   20 y.o. Male  MRN: [id number] Visit Date: 01/23/2021  Today's healthcare provider: Mila Merry, MD   No chief complaint on file.  Subjective    URI  This is a new problem. The current episode started on 01/20/2021. The problem has been gradually worsening. There has been no fever. Associated symptoms include chills and sweats, congestion, coughing, headaches, nausea, a sore throat and wheezing. Pertinent negatives include no fever, abdominal pain, diarrhea, ear pain, plugged ear sensation, rhinorrhea, sinus pain or vomiting. The treatment provided no relief.  He did take a Covid test on 11/28 which was negative. He has not been able to go to work since onset of symptoms.   Review of Systems  Constitutional:  Positive for chills. Negative for activity change, appetite change, diaphoresis, fatigue, fever and unexpected weight change.  HENT:  Positive for congestion and sore throat. Negative for ear discharge, ear pain, postnasal drip, rhinorrhea, sinus pressure, sinus pain and trouble swallowing.   Eyes:  Positive for discharge. Negative for photophobia, pain, redness, itching and visual  disturbance.  Respiratory:  Positive for cough, shortness of breath and wheezing. Negative for apnea, choking, chest tightness and stridor.   Gastrointestinal:  Positive for nausea. Negative for abdominal distention, abdominal pain, anal bleeding, blood in stool, constipation, diarrhea, rectal pain and vomiting.  Neurological:  Positive for headaches. Negative for dizziness and light-headedness.     Objective    There were no vitals taken for this visit.   Physical Exam   Awake, oriented x 3. In no apparent distress. Very tired in appearence, raspy voice.    Assessment & Plan     1. Upper respiratory tract infection, unspecified type  - azithromycin (ZITHROMAX) 250 MG tablet; Take 2 tablets on day 1, then 1 tablet daily on days 2 through 5  Dispense: 6 tablet; Refill: 0  2. Acute cough  - HYDROcodone bit-homatropine (HYCODAN) 5-1.5 MG/5ML syrup; Take 5 mLs by mouth every 8 (eight) hours as needed for cough.  Dispense: 60 mL; Refill: 0   Work excuse starting 12-21-2020. RTW 12-5.22    I discussed the assessment and treatment plan with the patient. The patient was provided an opportunity to ask questions and all were answered. The patient agreed with the plan and demonstrated an understanding of the instructions.   The patient was advised to call back or seek an in-person evaluation if the symptoms worsen or if the condition fails to improve as anticipated.  I provided 12 minutes of non-face-to-face time during this encounter.  The entirety of the information documented in the History of Present Illness, Review of Systems and Physical Exam were personally obtained by me. Portions of this information were initially documented by  the CMA and reviewed by me for thoroughness and accuracy.    Mila Merry, MD [facility] [phone] (phone) [phone] (fax)  [facility]

## 2021-01-24 ENCOUNTER — Other Ambulatory Visit: Payer: Self-pay

## 2021-01-24 ENCOUNTER — Encounter: Payer: Self-pay | Admitting: Urology

## 2021-01-24 ENCOUNTER — Ambulatory Visit (INDEPENDENT_AMBULATORY_CARE_PROVIDER_SITE_OTHER): Payer: Medicaid Other | Admitting: Urology

## 2021-01-24 VITALS — BP 110/77 | HR 81 | Ht 70.0 in | Wt 133.0 lb

## 2021-01-24 DIAGNOSIS — N5082 Scrotal pain: Secondary | ICD-10-CM | POA: Diagnosis not present

## 2021-01-24 DIAGNOSIS — R102 Pelvic and perineal pain: Secondary | ICD-10-CM | POA: Diagnosis not present

## 2021-01-24 DIAGNOSIS — N50819 Testicular pain, unspecified: Secondary | ICD-10-CM

## 2021-01-24 LAB — URINALYSIS, COMPLETE
Bilirubin, UA: NEGATIVE
Glucose, UA: NEGATIVE
Leukocytes,UA: NEGATIVE
Nitrite, UA: NEGATIVE
RBC, UA: NEGATIVE
Specific Gravity, UA: >1.03 — ABNORMAL HIGH (ref 1.005–1.030)
Urobilinogen, Ur: 0.2 mg/dL (ref 0.2–1.0)
pH, UA: 5.5 (ref 5.0–7.5)

## 2021-01-24 LAB — MICROSCOPIC EXAMINATION
Bacteria, UA: NONE SEEN
Epithelial Cells (non renal): NONE SEEN /hpf (ref 0–10)

## 2021-01-26 MED ORDER — AMITRIPTYLINE HCL 10 MG PO TABS: 10.0000 mg | ORAL_TABLET | Freq: Every day | ORAL | 0 refills | Status: DC

## 2021-01-26 NOTE — Progress Notes (Signed)
01/24/2021 11:21 AM   Travis Love Mar 19, 2000 [id number]  Referring provider: No referring provider defined for this encounter.  Chief Complaint  Patient presents with   Testicle Pain    HPI: Travis Love is a 20 y.o. male who presents for evaluation of scrotal pain.  1 year history intermittent left hemiscrotal pain No identifiable precipitating, aggravating or alleviating factors No history of trauma At its worst severity is rated 7/10 No prior treatments; taking ibuprofen Occasionally notes pain radiating down the leg Does have intermittent back pain but not severe No history of disc disease Seen in ED 01/16/2021; scrotal sonogram was performed which showed normal-appearing testes bilaterally.  A 5 mm left spermatocele was identified as well as a left varicocele   PMH: History reviewed. No pertinent past medical history.  Surgical History: Past Surgical History:  Procedure Laterality Date   NO PAST SURGERIES     WISDOM TOOTH EXTRACTION      Home Medications:  Allergies as of 01/24/2021       Reactions   Dust Mite Extract    Johnson Grass    Other Itching   Pollen Extract    Shellfish Allergy         Medication List        Accurate as of January 24, 2021 11:59 PM. If you have any questions, ask your nurse or doctor.          azithromycin 250 MG tablet Commonly known as: ZITHROMAX Take 2 tablets on day 1, then 1 tablet daily on days 2 through 5   EpiPen 2-Pak 0.3 mg/0.3 mL Soaj injection Generic drug: EPINEPHrine use as directed by prescriber  FOR SYSTEMIC REACTION INJECTION 4 DAYS   HYDROcodone bit-homatropine 5-1.5 MG/5ML syrup Commonly known as: HYCODAN Take 5 mLs by mouth every 8 (eight) hours as needed for cough.        Allergies:  Allergies  Allergen Reactions   Dust Mite Extract    Johnson Grass    Other Itching   Pollen Extract    Shellfish Allergy     Family History: Family History  Problem Relation Age of Onset    Headache Mother    Healthy Father    Healthy Sister    Hyperlipidemia Paternal Grandmother    CVA Maternal Grandfather    Diabetes Maternal Grandfather     Social History:  reports that he has been smoking cigarettes. He has been smoking an average of .5 packs per day. He has never used smokeless tobacco. He reports current alcohol use. He reports current drug use. Drug: Marijuana.   Physical Exam: BP 110/77   Pulse 81   Ht 5\' 10"  (1.778 m)   Wt 133 lb (60.3 kg)   BMI 19.08 kg/m   Constitutional:  Alert and oriented, No acute distress. HEENT: [place] AT, moist mucus membranes.  Trachea midline, no masses. Cardiovascular: No clubbing, cyanosis, or edema. Respiratory: Normal respiratory effort, no increased work of breathing. GI: Abdomen is soft, nontender, nondistended, no abdominal masses GU: Phallus without lesions, testes descended bilaterally without masses or tenderness.  Normal size bilaterally.  A left varicocele is not palpable.  Spermatic cord/epididymis palpably normal bilaterally Skin: No rashes, bruises or suspicious lesions. Neurologic: Grossly intact, no focal deficits, moving all 4 extremities. Psychiatric: Normal mood and affect.  Laboratory Data:  Urinalysis Dipstick/microscopy negative  Pertinent Imaging: Ultrasound images personally reviewed and interpreted   Assessment & Plan:    1.  Right scrotal pain No left  varicocele palpable on exam and this would not be a source of his pain We discussed that a a cause of scrotal pain is not identified and a significant number of patients who are evaluated for scrotal pain Potential causes include neuropathic pain of unknown origin Schedule pelvic CT to evaluate for any obvious cause of his pain Trial amitriptyline 10 mg nightly; if no improvement or side effects will titrate to 25 mg   Riki Altes, MD  [facility] [address], [address] [place], [place] [address] [phone]

## 2021-01-28 ENCOUNTER — Encounter: Payer: Self-pay | Admitting: Urology

## 2021-01-28 DIAGNOSIS — Z720 Tobacco use: Secondary | ICD-10-CM | POA: Diagnosis not present

## 2021-01-28 DIAGNOSIS — F602 Antisocial personality disorder: Secondary | ICD-10-CM | POA: Diagnosis not present

## 2021-01-28 DIAGNOSIS — F1299 Cannabis use, unspecified with unspecified cannabis-induced disorder: Secondary | ICD-10-CM | POA: Diagnosis not present

## 2021-01-30 DIAGNOSIS — Z720 Tobacco use: Secondary | ICD-10-CM | POA: Diagnosis not present

## 2021-01-30 DIAGNOSIS — F602 Antisocial personality disorder: Secondary | ICD-10-CM | POA: Diagnosis not present

## 2021-01-30 DIAGNOSIS — F1299 Cannabis use, unspecified with unspecified cannabis-induced disorder: Secondary | ICD-10-CM | POA: Diagnosis not present

## 2021-01-31 ENCOUNTER — Ambulatory Visit: Payer: Self-pay | Admitting: *Deleted

## 2021-01-31 NOTE — Telephone Encounter (Signed)
Reason for Disposition  SEVERE pain (e.g., excruciating)  Answer Assessment - Initial Assessment Questions 1. LOCATION and RADIATION: "Where is the pain located?"      Left side  2. QUALITY: "What does the pain feel like?"  (e.g., sharp, dull, aching, burning)     Aching  3. SEVERITY: "How bad is the pain?"  (Scale 1-10; or mild, moderate, severe)   - MILD (1-3): doesn't interfere with normal activities    - MODERATE (4-7): interferes with normal activities (e.g., work or school) or awakens from sleep   - SEVERE (8-10): excruciating pain, unable to do any normal activities, difficulty walking     Severe  4. ONSET: "When did the pain start?"     Yesterday  5. PATTERN: "Does it come and go, or has it been constant since it started?"     Comes and goes 6. SCROTAL APPEARANCE: "What does the scrotum look like?" "Is there any swelling or redness?"      No swelling now  7. HERNIA: "Has a doctor ever told you that you have a hernia?"     Suspected hernia  8. OTHER SYMPTOMS: "Do you have any other symptoms?" (e.g., fever, abdominal pain, vomiting, difficulty passing urine)     Abdominal pain, "hot flashes", urinary retention, difficulty walking  Protocols used: Scrotal Pain-A-AH

## 2021-01-31 NOTE — Telephone Encounter (Signed)
C/o severe testicular pain since yesterd ay swelling comes and goes none now. C/o low abdominal pain , groin pain, urinary retention. Difficulty walking . Has been seen for similar issues before. Pain back . Instructed patient to go to ED. Patient's wife with patient and report she will take patient to ED.

## 2021-01-31 NOTE — Telephone Encounter (Signed)
Noted  Pt informed to go to ED for care.  KP

## 2021-02-01 ENCOUNTER — Ambulatory Visit
Admission: RE | Admit: 2021-02-01 | Discharge: 2021-02-01 | Disposition: A | Discharge status: Home / Self Care | Payer: Medicaid Other | Source: Ambulatory Visit | Attending: Urology | Admitting: Urology

## 2021-02-01 ENCOUNTER — Telehealth: Payer: Self-pay | Admitting: Urology

## 2021-02-01 ENCOUNTER — Encounter: Payer: Self-pay | Admitting: Urology

## 2021-02-01 ENCOUNTER — Other Ambulatory Visit: Payer: Self-pay

## 2021-02-01 ENCOUNTER — Ambulatory Visit (INDEPENDENT_AMBULATORY_CARE_PROVIDER_SITE_OTHER): Payer: Medicaid Other | Admitting: Urology

## 2021-02-01 VITALS — BP 117/80 | HR 79 | Ht 70.0 in | Wt 130.0 lb

## 2021-02-01 DIAGNOSIS — N5082 Scrotal pain: Secondary | ICD-10-CM | POA: Insufficient documentation

## 2021-02-01 DIAGNOSIS — R1032 Left lower quadrant pain: Secondary | ICD-10-CM | POA: Diagnosis not present

## 2021-02-01 DIAGNOSIS — R102 Pelvic and perineal pain: Secondary | ICD-10-CM | POA: Diagnosis not present

## 2021-02-01 DIAGNOSIS — N200 Calculus of kidney: Secondary | ICD-10-CM | POA: Diagnosis not present

## 2021-02-01 LAB — URINALYSIS, COMPLETE
Bilirubin, UA: NEGATIVE
Glucose, UA: NEGATIVE
Leukocytes,UA: NEGATIVE
Nitrite, UA: NEGATIVE
Protein,UA: NEGATIVE
RBC, UA: NEGATIVE
Specific Gravity, UA: >1.03 — ABNORMAL HIGH (ref 1.005–1.030)
Urobilinogen, Ur: 0.2 mg/dL (ref 0.2–1.0)
pH, UA: 5.5 (ref 5.0–7.5)

## 2021-02-01 LAB — MICROSCOPIC EXAMINATION: Bacteria, UA: NONE SEEN

## 2021-02-01 MED ORDER — ONDANSETRON HCL 4 MG PO TABS: 4.0000 mg | ORAL_TABLET | Freq: Three times a day (TID) | ORAL | 0 refills | Status: DC | PRN

## 2021-02-01 MED ORDER — HYDROCODONE-ACETAMINOPHEN 5-325 MG PO TABS: 1.0000 | ORAL_TABLET | ORAL | 0 refills | Status: DC | PRN

## 2021-02-01 MED ORDER — TAMSULOSIN HCL 0.4 MG PO CAPS: 0.4000 mg | ORAL_CAPSULE | Freq: Every day | ORAL | 0 refills | Status: DC

## 2021-02-01 NOTE — Progress Notes (Signed)
02/01/2021 1:23 PM   Travis Love 23-Oct-2000 [id number]  Referring provider: No referring provider defined for this encounter.  Chief Complaint  Patient presents with   Follow-up    HPI: 20 y.o. male called for an acute visit for left lower quadrant abdominal and scrotal pain.  Initially seen 01/24/2021 Scrotal sonogram and exam unremarkable Given a trial of amitriptyline and pelvic CT scheduled Yesterday had onset of severe left scrotal pain that radiated to LLQ  Scrotal pain resolved and has had left lower quadrant abdominal pain Associated with nausea and sensation of urinary retention No fever or chills Did note left hemiscrotal swelling yesterday which has resolved   PMH: History reviewed. No pertinent past medical history.  Surgical History: Past Surgical History:  Procedure Laterality Date   NO PAST SURGERIES     WISDOM TOOTH EXTRACTION      Home Medications:  Allergies as of 02/01/2021       Reactions   Dust Mite Extract    Johnson Grass    Other Itching   Pollen Extract    Shellfish Allergy         Medication List        Accurate as of February 01, 2021  1:23 PM. If you have any questions, ask your nurse or doctor.          amitriptyline 10 MG tablet Commonly known as: ELAVIL Take 1 tablet (10 mg total) by mouth at bedtime.   EpiPen 2-Pak 0.3 mg/0.3 mL Soaj injection Generic drug: EPINEPHrine use as directed by prescriber  FOR SYSTEMIC REACTION INJECTION 4 DAYS   HYDROcodone bit-homatropine 5-1.5 MG/5ML syrup Commonly known as: HYCODAN Take 5 mLs by mouth every 8 (eight) hours as needed for cough.        Allergies:  Allergies  Allergen Reactions   Dust Mite Extract    Johnson Grass    Other Itching   Pollen Extract    Shellfish Allergy     Family History: Family History  Problem Relation Age of Onset   Headache Mother    Healthy Father    Healthy Sister    Hyperlipidemia Paternal Grandmother    CVA Maternal  Grandfather    Diabetes Maternal Grandfather     Social History:  reports that he has been smoking cigarettes. He has been smoking an average of .5 packs per day. He has never used smokeless tobacco. He reports current alcohol use. He reports current drug use. Drug: Marijuana.   Physical Exam: BP 117/80   Pulse 79   Ht 5\' 10"  (1.778 m)   Wt 130 lb (59 kg)   BMI 18.65 kg/m   Constitutional:  Alert and oriented, mild distress secondary to abdominal pain. HEENT: [place] AT, moist mucus membranes.  Trachea midline, no masses. Cardiovascular: No clubbing, cyanosis, or edema. Respiratory: Normal respiratory effort, no increased work of breathing. GI: Abdomen is soft, nontender, nondistended, no abdominal masses GU: GU phallus without lesions.  Testes descended bilaterally without masses or tenderness  Laboratory Data:  Urinalysis Dipstick/microscopy negative   Assessment & Plan:   Intermittent left hemiscrotal pain since late September Onset yesterday of left hemiscrotal/left lower quadrant abdominal pain which was severe and associated with nausea and sensation of urinary retention Pain now primarily in the left lower quadrant Urinalysis unremarkable Stat CT abdomen pelvis ordered to evaluate for ureteral calculus   October, MD  [facility] [address] [place], [place] [phone]

## 2021-02-01 NOTE — Telephone Encounter (Signed)
FormCT showed a probable millimeter left UVJ calculus.  There is no hydronephrosis present and he has minimal fat on CT however based on his symptoms this most likely represents a UVJ calculus.  I contacted Travis Love and informed him of the CT findings.  He did have some slight bowel abnormalities and the radiologist's indicated he could not rule out a nonobstructing internal hernia however no definite obstruction.  Prescriptions for tamsulosin, Zofran and hydrocodone were sent to his pharmacy.  He was directed to call or proceed to the ED for pain not controlled with oral analgesics or development of fever   If he has not passed a stone by Monday and is having persistent symptoms he was instructed to call and can schedule ureteroscopy for Tuesday

## 2021-02-04 ENCOUNTER — Telehealth: Payer: Self-pay | Admitting: Urology

## 2021-02-04 DIAGNOSIS — N201 Calculus of ureter: Secondary | ICD-10-CM

## 2021-02-04 DIAGNOSIS — Z720 Tobacco use: Secondary | ICD-10-CM | POA: Diagnosis not present

## 2021-02-04 DIAGNOSIS — N5082 Scrotal pain: Secondary | ICD-10-CM

## 2021-02-04 DIAGNOSIS — F1299 Cannabis use, unspecified with unspecified cannabis-induced disorder: Secondary | ICD-10-CM | POA: Diagnosis not present

## 2021-02-04 DIAGNOSIS — F602 Antisocial personality disorder: Secondary | ICD-10-CM | POA: Diagnosis not present

## 2021-02-04 NOTE — Telephone Encounter (Signed)
The pt and his mother called in and would like a referral to [facility] for a 2nd opinion and would like to know if you could send the referral. Pt would like you to call his mother back today if possible and let her know, as he will be at work and will not be able to answer. Her callback number is [phone].

## 2021-02-04 NOTE — Telephone Encounter (Signed)
I am happy to do the referral.  After I put in the referral let her know that our referral coordinator will pass along the information to [facility] and they will contact him for an appointment

## 2021-02-05 DIAGNOSIS — R109 Unspecified abdominal pain: Secondary | ICD-10-CM | POA: Diagnosis not present

## 2021-02-05 DIAGNOSIS — I861 Scrotal varices: Secondary | ICD-10-CM | POA: Diagnosis not present

## 2021-02-05 DIAGNOSIS — N492 Inflammatory disorders of scrotum: Secondary | ICD-10-CM | POA: Diagnosis not present

## 2021-02-05 DIAGNOSIS — N50812 Left testicular pain: Secondary | ICD-10-CM | POA: Diagnosis not present

## 2021-02-05 NOTE — Telephone Encounter (Signed)
Informed pts mother. Thank you!

## 2021-02-06 ENCOUNTER — Telehealth: Payer: Self-pay

## 2021-02-06 NOTE — Telephone Encounter (Signed)
Transition Care Management Follow-up Telephone Call Date of discharge and from where: 02/05/2021-[facility]  How have you been since you were released from the hospital? Patient stated he is doing ok but feeling tired.  Any questions or concerns? No  Items Reviewed: Did the pt receive and understand the discharge instructions provided? Yes  Medications obtained and verified?  No new medications given at discharge.  Other? No  Any new allergies since your discharge? No  Dietary orders reviewed? No Do you have support at home? Yes   Home Care and Equipment/Supplies: Were home health services ordered? not applicable If so, what is the name of the agency? N/A  Has the agency set up a time to come to the patient's home? not applicable Were any new equipment or medical supplies ordered?  No What is the name of the medical supply agency? N/A Were you able to get the supplies/equipment? not applicable Do you have any questions related to the use of the equipment or supplies? No  Functional Questionnaire: (I = Independent and D = Dependent) ADLs: I  Bathing/Dressing- I  Meal Prep- I  Eating- I  Maintaining continence- I  Transferring/Ambulation- I  Managing Meds- I  Follow up appointments reviewed:  PCP Hospital f/u appt confirmed? No   Specialist Hospital f/u appt confirmed? No  . Are transportation arrangements needed? No  If their condition worsens, is the pt aware to call PCP or go to the Emergency Dept.? Yes Was the patient provided with contact information for the PCP's office or ED? Yes Was to pt encouraged to call back with questions or concerns? Yes

## 2021-02-06 NOTE — Progress Notes (Signed)
Established patient visit   Patient: Travis Love   DOB: 02-11-2001   20 y.o. Male  MRN: [id number] Visit Date: 02/07/2021  Today's healthcare provider: Alfredia Ferguson, PA-C   Cc. ED f/u  Subjective    HPI  Travis Love is a 20 year old male who presents today for follow-up of emergency room visit 02/05/2021.  He has been seeing several different physicians including urgent care and urology for scrotal and lower abdominal pain along with scrotal swelling.  During the last visit he was identified to have a varicocele and had an abdominal CT which demonstrated a possible hernia.  Testicular torsion was ruled out.  Referrals were placed for general surgery and urology to further evaluate these findings.  The patient is in significant 6 out of 10 pretty much constant pain which he is managing with hydrocodone that was prescribed, Tylenol/Advil.  He also feels nauseous intermittently which she was treating with Fosamax?  At home.  Him and his mother also states that he takes amitriptyline at night. They have contacted [facility] and urology and are expecting a call back to schedule appointments to coordinate surgical procedures.  Denies any changes in his bowel movements, no blood in his stool.  Reports his pain today is primarily in the left lower quadrant of his abdomen and his left testicle.  The only thing that significantly improves his pain is post ejaculation.   Medications: Outpatient Medications Prior to Visit  Medication Sig   EPIPEN 2-PAK 0.3 MG/0.3ML SOAJ injection use as directed by prescriber  FOR SYSTEMIC REACTION INJECTION 4 DAYS   HYDROcodone-acetaminophen (NORCO/VICODIN) 5-325 MG tablet Take 1 tablet by mouth every 4 (four) hours as needed for moderate pain.   tamsulosin (FLOMAX) 0.4 MG CAPS capsule Take 1 capsule (0.4 mg total) by mouth daily after breakfast.   [DISCONTINUED] ondansetron (ZOFRAN) 4 MG tablet Take 1 tablet (4 mg total) by mouth every 8 (eight) hours as needed for  nausea or vomiting.   No facility-administered medications prior to visit.    Review of Systems  Gastrointestinal:  Positive for abdominal pain and nausea. Negative for constipation and diarrhea.  Genitourinary:  Positive for testicular pain. Negative for difficulty urinating, dysuria, penile pain, penile swelling and scrotal swelling.  All other systems reviewed and are negative.  Last CBC Lab Results  Component Value Date   WBC 7.5 08/19/2019   HGB 14.5 08/19/2019   HCT 43.3 08/19/2019   MCV 88 08/19/2019   MCH 29.5 08/19/2019   RDW 12.2 08/19/2019   PLT 245 08/19/2019   Last metabolic panel Lab Results  Component Value Date   GLUCOSE 67 08/19/2019   NA 139 08/19/2019   K 4.2 08/19/2019   CL 100 08/19/2019   CO2 26 08/19/2019   BUN 9 08/19/2019   CREATININE 1.01 08/19/2019   GFRNONAA 107 08/19/2019   CALCIUM 9.4 08/19/2019   PROT 7.5 08/19/2019   ALBUMIN 4.7 08/19/2019   LABGLOB 2.8 08/19/2019   AGRATIO 1.7 08/19/2019   BILITOT 0.3 08/19/2019   ALKPHOS 118 08/19/2019   AST 18 08/19/2019   ALT 7 08/19/2019   ANIONGAP 8 02/24/2018   Last lipids No results found for: CHOL, HDL, LDLCALC, LDLDIRECT, TRIG, CHOLHDL Last hemoglobin A1c No results found for: HGBA1C Last thyroid functions No results found for: TSH, T3TOTAL, T4TOTAL, THYROIDAB Last vitamin D No results found for: 25OHVITD2, 25OHVITD3, VD25OH Last vitamin B12 and Folate No results found for: VITAMINB12, FOLATE     Objective  BP 124/87 (BP Location: Right Arm, Patient Position: Sitting, Cuff Size: Normal)    Pulse 76    Ht 5\' 10"  (1.778 m)    Wt 130 lb 12.8 oz (59.3 kg)    SpO2 98%    BMI 18.77 kg/m  BP Readings from Last 3 Encounters:  02/07/21 124/87  02/01/21 117/80  01/24/21 110/77   Wt Readings from Last 3 Encounters:  02/07/21 130 lb 12.8 oz (59.3 kg)  02/01/21 130 lb (59 kg)  01/24/21 133 lb (60.3 kg)      Physical Exam Constitutional:      General: He is awake.     Appearance:  He is well-developed.  HENT:     Head: Normocephalic.  Eyes:     Conjunctiva/sclera: Conjunctivae normal.  Cardiovascular:     Rate and Rhythm: Normal rate and regular rhythm.     Heart sounds: Normal heart sounds.  Pulmonary:     Effort: Pulmonary effort is normal.     Breath sounds: Normal breath sounds.  Abdominal:     Tenderness: There is abdominal tenderness in the left lower quadrant. There is no guarding or rebound.     Hernia: A hernia is present. Hernia is present in the left inguinal area.  Genitourinary:    Testes:        Left: Tenderness and varicocele present. Swelling not present.     Comments: A small bulge is felt in the left inguinal canal Skin:    General: Skin is warm.  Neurological:     Mental Status: He is alert and oriented to person, place, and time.  Psychiatric:        Attention and Perception: Attention normal.        Mood and Affect: Mood normal.        Speech: Speech normal.        Behavior: Behavior is cooperative.     No results found for any visits on 02/07/21.  Assessment & Plan     Left inguinal hernia Patient instructed on hernia warning signs and when to return to the emergency room.  Verbalized understanding.  Advised his mother to contact [facility] today and if she cannot coordinate an appointment for consultation to let [facility] know we can try to coordinate something on our end.  Patient primarily feeling nauseous, is taking Fosamax?  I do not see this on his medication list. Advised him to discontinue this as he feels its not working and to take Zofran.  2. Scrotal pain Patient managing pain with hydrocodone, Advil and Tylenol.  They also mention taking amitriptyline at night, I also do not see this on his medication list.  Explained that for this to relieve pain it typically takes a few weeks and is not my first choice for acute pain medication.  No clarification if he is taking this medication for another disorder.  I did advise if his nausea does  not abate with d/c hydrocodone and trying the Zofran, that he can stop the amitriptyline for few days to see if this stops his nausea. I explained that the hydrocodone is probably contributing to his nausea.  Advised ice/warm compresses if this improves the pain.  See above for GI referral, same thing with the urology referral.  Advised him of warning signs of testicular torsion.  Return if symptoms worsen or fail to improve.      I, [place], PA-C have reviewed all documentation for this visit. The documentation on  02/07/2021 for  the exam, diagnosis, procedures, and orders are all accurate and complete.    Alfredia Ferguson, PA-C  [facility] [phone] (phone) [phone] (fax)  [facility]

## 2021-02-07 ENCOUNTER — Encounter: Payer: Self-pay | Admitting: Physician Assistant

## 2021-02-07 ENCOUNTER — Other Ambulatory Visit: Payer: Self-pay

## 2021-02-07 ENCOUNTER — Ambulatory Visit (INDEPENDENT_AMBULATORY_CARE_PROVIDER_SITE_OTHER): Payer: Medicaid Other | Admitting: Physician Assistant

## 2021-02-07 VITALS — BP 124/87 | HR 76 | Ht 70.0 in | Wt 130.8 lb

## 2021-02-07 DIAGNOSIS — R110 Nausea: Secondary | ICD-10-CM

## 2021-02-07 DIAGNOSIS — K4090 Unilateral inguinal hernia, without obstruction or gangrene, not specified as recurrent: Secondary | ICD-10-CM | POA: Diagnosis not present

## 2021-02-07 DIAGNOSIS — N5082 Scrotal pain: Secondary | ICD-10-CM

## 2021-02-07 MED ORDER — ONDANSETRON 4 MG PO TBDP: 4.0000 mg | ORAL_TABLET | Freq: Three times a day (TID) | ORAL | 0 refills | Status: DC | PRN

## 2021-02-08 ENCOUNTER — Other Ambulatory Visit: Payer: Medicaid Other

## 2021-02-11 ENCOUNTER — Telehealth: Payer: Self-pay

## 2021-02-11 ENCOUNTER — Encounter: Payer: Self-pay | Admitting: Physician Assistant

## 2021-02-11 NOTE — Progress Notes (Signed)
Excuse from work

## 2021-02-11 NOTE — Telephone Encounter (Signed)
Copied from CRM [id number]. Topic: General - Other >> Feb 11, 2021  3:12 PM Gaetana Michaelis A wrote: Reason for CRM: The patient's mother has called to notify their PCP that the swelling in their groin has continued and they wish to remain out of work   The patient's mother shares that this concern has been previously discussed with Prov. Drubel   Please contact further if needed

## 2021-02-12 ENCOUNTER — Telehealth: Payer: Self-pay

## 2021-02-12 DIAGNOSIS — Z720 Tobacco use: Secondary | ICD-10-CM | POA: Diagnosis not present

## 2021-02-12 DIAGNOSIS — F1299 Cannabis use, unspecified with unspecified cannabis-induced disorder: Secondary | ICD-10-CM | POA: Diagnosis not present

## 2021-02-12 DIAGNOSIS — F602 Antisocial personality disorder: Secondary | ICD-10-CM | POA: Diagnosis not present

## 2021-02-12 NOTE — Telephone Encounter (Signed)
Patient's mother aware that letter printed and placed up front ready for pick up.

## 2021-02-12 NOTE — Telephone Encounter (Signed)
Copied from CRM [id number]. Topic: General - Other >> Feb 12, 2021  3:07 PM McGill, Darlina Rumpf wrote: Reason for CRM: The patient's mother has called asking if she can pick up the letter excusing her son from work.  Please advise if letter is ready for pick up.

## 2021-02-13 ENCOUNTER — Ambulatory Visit: Admission: RE | Admit: 2021-02-13 | Payer: Medicaid Other | Source: Ambulatory Visit

## 2021-02-14 DIAGNOSIS — F602 Antisocial personality disorder: Secondary | ICD-10-CM | POA: Diagnosis not present

## 2021-02-14 DIAGNOSIS — Z720 Tobacco use: Secondary | ICD-10-CM | POA: Diagnosis not present

## 2021-02-14 DIAGNOSIS — F1299 Cannabis use, unspecified with unspecified cannabis-induced disorder: Secondary | ICD-10-CM | POA: Diagnosis not present

## 2021-02-21 ENCOUNTER — Ambulatory Visit: Payer: Self-pay | Admitting: *Deleted

## 2021-02-21 NOTE — Telephone Encounter (Signed)
Please Review

## 2021-02-21 NOTE — Telephone Encounter (Addendum)
Summary: knot on inside of his thigh left leg   Pt's mother on the line stated pt has a knot on inside of his thigh left leg is in a lot of pain ( Not severe) . 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      Chief Complaint: knot on right groin Symptoms: hard and painful like a boil Frequency: overnight Pertinent Negatives: Patient denies no fever and no heat to the area Disposition: [] ED /[] Urgent Care (no appt availability in office) / [] Appointment(In office/virtual)/ []  [facility]/ [] Home Care/ [x] Refused Recommended Disposition  Additional Notes: The Patient has other testicle issues and is awaiting appointment with urology and for that fact I wanted to schedule him to be seen with this knot/cyst.  Reason for Disposition  Red tender lump < 1/2 inch across (< 12 mm; smaller than a marble)  Answer Assessment - Initial Assessment Questions 1. APPEARANCE of BOIL: "What does the boil look like?"      Pea-sized and hard 2. LOCATION: "Where is the boil located?"      groin 3. NUMBER: "How many boils are there?"      one 4. SIZE: "How big is the boil?" (e.g., inches, cm; compare to size of a coin or other object)      5. ONSET: "When did the boil start?"     overnight 6. PAIN: "Is there any pain?" If Yes, ask: "How bad is the pain?"   (Scale 1-10; or mild, moderate, severe)     5 7. FEVER: "Do you have a fever?" If Yes, ask: "What is it, how was it measured, and when did it start?"      none 8. SOURCE: "Have you been around anyone with boils or other Staph infections?" "Have you ever had boils before?"      9. OTHER SYMPTOMS: "Do you have any other symptoms?" (e.g., shaking chills, weakness, rash elsewhere on body)     None of these. Testicle problem awaiting Urology appointment 10. PREGNANCY: "Is there any chance you are pregnant?" "When was your last menstrual period?"       na  Protocols used: Boil (Skin Abscess)-A-AH

## 2021-02-21 NOTE — Telephone Encounter (Signed)
Please advise 

## 2021-02-22 ENCOUNTER — Encounter: Payer: Self-pay | Admitting: Physician Assistant

## 2021-02-22 NOTE — Telephone Encounter (Signed)
Patient's mother was advised and patient was scheduled with his provider next week. Maxine Glenn is requesting a work note to extend patient's absence from work. Please advise?

## 2021-02-22 NOTE — Telephone Encounter (Signed)
We can try to fit him in next week?

## 2021-02-26 NOTE — Progress Notes (Signed)
Established patient visit   Patient: Travis Love   DOB: Sep 17, 2000   20 y.o. Male  MRN: [id number] Visit Date: 02/27/2021  Today's healthcare provider: Alfredia Ferguson, PA-C   Chief Complaint  Patient presents with   Cyst   I,Sulibeya S Dimas,acting as a scribe for Alfredia Ferguson, PA-C.,have documented all relevant documentation on the behalf of Alfredia Ferguson, PA-C,as directed by  Alfredia Ferguson, PA-C while in the presence of Alfredia Ferguson, PA-C.  Subjective    HPI  Travis Love is a 21 y/o male who presents today with a painful lump on the inside of his left light x 6 days. Reports today it feels much better.  Patient reports knot on inside of his thigh on left leg x 6 days. Patient reports lesion is much better. Denies any pain today.   Medications: Outpatient Medications Prior to Visit  Medication Sig   ondansetron (ZOFRAN-ODT) 4 MG disintegrating tablet Take 1 tablet (4 mg total) by mouth every 8 (eight) hours as needed for nausea or vomiting.   tamsulosin (FLOMAX) 0.4 MG CAPS capsule Take 1 capsule (0.4 mg total) by mouth daily after breakfast.   EPIPEN 2-PAK 0.3 MG/0.3ML SOAJ injection use as directed by prescriber  FOR SYSTEMIC REACTION INJECTION 4 DAYS   HYDROcodone-acetaminophen (NORCO/VICODIN) 5-325 MG tablet Take 1 tablet by mouth every 4 (four) hours as needed for moderate pain. (Patient not taking: Reported on 02/27/2021)   No facility-administered medications prior to visit.    Review of Systems  Skin:  Positive for color change.       Lump in groin       Objective    BP 125/73 (BP Location: Right Arm, Patient Position: Sitting, Cuff Size: Normal)    Pulse 85    Temp 98 F (36.7 C) (Oral)    Resp 16    Wt 134 lb (60.8 kg)    SpO2 99%    BMI 19.23 kg/m  BP Readings from Last 3 Encounters:  02/27/21 125/73  02/07/21 124/87  02/01/21 117/80   Wt Readings from Last 3 Encounters:  02/27/21 134 lb (60.8 kg)  02/07/21 130 lb 12.8 oz (59.3 kg)  02/01/21 130  lb (59 kg)      Physical Exam Constitutional:      Appearance: He is not ill-appearing.  HENT:     Head: Normocephalic.  Eyes:     Conjunctiva/sclera: Conjunctivae normal.  Cardiovascular:     Rate and Rhythm: Normal rate.  Pulmonary:     Effort: Pulmonary effort is normal. No respiratory distress.  Skin:    Comments: L groin with a papule, ingrown hair. Drainage opening.  Neurological:     General: No focal deficit present.     Mental Status: He is alert and oriented to person, place, and time.  Psychiatric:        Mood and Affect: Mood normal.        Behavior: Behavior normal.     No results found for any visits on 02/27/21.  Assessment & Plan     Folliculitis Advised topical warm compresses.   2. Inguinal Hernia 3. Left varicocele Pt has appt with uro end of February, but is in significant pain and unable to stand for extended periods of time. Mildly controlled w/ tylenol and advil. They have not been contacted yet for gen. Surg referral w/ [facility]. Due to significant impact on quality of life, ref to gen surg in [facility], and gave a work  note for light duty.   Return if symptoms worsen or fail to improve.      I, Alfredia Ferguson, PA-C have reviewed all documentation for this visit. The documentation on  02/27/2021 for the exam, diagnosis, procedures, and orders are all accurate and complete.    Alfredia Ferguson, PA-C  [facility] [phone] (phone) [phone] (fax)  [facility]

## 2021-02-27 ENCOUNTER — Ambulatory Visit (INDEPENDENT_AMBULATORY_CARE_PROVIDER_SITE_OTHER): Payer: Medicaid Other | Admitting: Physician Assistant

## 2021-02-27 ENCOUNTER — Encounter: Payer: Self-pay | Admitting: Physician Assistant

## 2021-02-27 ENCOUNTER — Other Ambulatory Visit: Payer: Self-pay

## 2021-02-27 VITALS — BP 125/73 | HR 85 | Temp 98.0°F | Resp 16 | Wt 134.0 lb

## 2021-02-27 DIAGNOSIS — I861 Scrotal varices: Secondary | ICD-10-CM

## 2021-02-27 DIAGNOSIS — K4090 Unilateral inguinal hernia, without obstruction or gangrene, not specified as recurrent: Secondary | ICD-10-CM | POA: Diagnosis not present

## 2021-02-27 DIAGNOSIS — N5082 Scrotal pain: Secondary | ICD-10-CM

## 2021-02-27 NOTE — Telephone Encounter (Signed)
Patient seen in office today. 

## 2021-02-28 DIAGNOSIS — F1299 Cannabis use, unspecified with unspecified cannabis-induced disorder: Secondary | ICD-10-CM | POA: Diagnosis not present

## 2021-02-28 DIAGNOSIS — F602 Antisocial personality disorder: Secondary | ICD-10-CM | POA: Diagnosis not present

## 2021-02-28 DIAGNOSIS — Z720 Tobacco use: Secondary | ICD-10-CM | POA: Diagnosis not present

## 2021-03-04 ENCOUNTER — Telehealth: Payer: Self-pay

## 2021-03-04 NOTE — Telephone Encounter (Signed)
Copied from CRM [id number]. Topic: General - Other >> Mar 04, 2021 12:50 PM Pawlus, Maxine Glenn A wrote: Reason for CRM: Pts mother called in asking if Alfredia Ferguson could write a doctors note excusing the pt from work Friday 03/01/21, please advise.

## 2021-03-05 ENCOUNTER — Telehealth: Payer: Self-pay

## 2021-03-05 NOTE — Telephone Encounter (Signed)
Patient requesting work note to be printed out. His mother will stop by the office today to pick it up.

## 2021-03-07 ENCOUNTER — Ambulatory Visit: Payer: Medicaid Other | Admitting: Surgery

## 2021-03-11 ENCOUNTER — Other Ambulatory Visit: Payer: Self-pay | Admitting: Urology

## 2021-03-12 ENCOUNTER — Encounter: Payer: Self-pay | Admitting: Surgery

## 2021-03-12 ENCOUNTER — Other Ambulatory Visit: Payer: Self-pay

## 2021-03-12 ENCOUNTER — Ambulatory Visit (INDEPENDENT_AMBULATORY_CARE_PROVIDER_SITE_OTHER): Payer: Medicaid Other | Admitting: Surgery

## 2021-03-12 ENCOUNTER — Ambulatory Visit: Payer: Self-pay | Admitting: Surgery

## 2021-03-12 VITALS — BP 128/79 | HR 82 | Temp 97.8°F | Ht 70.0 in | Wt 137.0 lb

## 2021-03-12 DIAGNOSIS — K4090 Unilateral inguinal hernia, without obstruction or gangrene, not specified as recurrent: Secondary | ICD-10-CM | POA: Diagnosis not present

## 2021-03-12 NOTE — Progress Notes (Signed)
Patient ID: Travis Love, male   DOB: January 18, 2001, 21 y.o.   MRN: [id number]  Chief Complaint: Left inguinal hernia  History of Present Illness Travis Love is a 21 y.o. male with left groin pain beginning about 3 months ago.  Reports an associated bulge, pain is exacerbated by prolonged standing, and coughing.  He does not do any heavy lifting at work.  Pain seems to be relieved by intercourse.  Reports an associated varicosity of the spermatic vein.  Denies any difficulty voiding.  Had missed work due to the onset of the pain beginning January 6.  Past Medical History Past Medical History:  Diagnosis Date   Hernia of scrotum       Past Surgical History:  Procedure Laterality Date   NO PAST SURGERIES     WISDOM TOOTH EXTRACTION      Allergies  Allergen Reactions   Dust Mite Extract    Johnson Grass    Other Itching   Pollen Extract    Shellfish Allergy     Current Outpatient Medications  Medication Sig Dispense Refill   EPIPEN 2-PAK 0.3 MG/0.3ML SOAJ injection use as directed by prescriber  FOR SYSTEMIC REACTION INJECTION 4 DAYS  0   ondansetron (ZOFRAN-ODT) 4 MG disintegrating tablet Take 1 tablet (4 mg total) by mouth every 8 (eight) hours as needed for nausea or vomiting. 20 tablet 0   tamsulosin (FLOMAX) 0.4 MG CAPS capsule Take 1 capsule (0.4 mg total) by mouth daily after breakfast. 14 capsule 0   No current facility-administered medications for this visit.    Family History Family History  Problem Relation Age of Onset   Headache Mother    Healthy Father    Healthy Sister    Hyperlipidemia Paternal Grandmother    CVA Maternal Grandfather    Diabetes Maternal Grandfather       Social History Social History   Tobacco Use   Smoking status: Some Days    Packs/day: 0.50    Types: Cigarettes   Smokeless tobacco: Never  Vaping Use   Vaping Use: Never used  Substance Use Topics   Alcohol use: Yes    Alcohol/week: 0.0 standard drinks   Drug use: Yes     Types: Marijuana        Review of Systems  Constitutional: Negative.   HENT: Negative.    Eyes: Negative.   Respiratory: Negative.    Cardiovascular: Negative.   Gastrointestinal: Negative.   Genitourinary: Negative.   Skin: Negative.   Neurological: Negative.   Psychiatric/Behavioral: Negative.       Physical Exam Blood pressure 128/79, pulse 82, temperature 97.8 F (36.6 C), temperature source Oral, height 5\' 10"  (1.778 m), weight 137 lb (62.1 kg), SpO2 98 %. Last Weight  Most recent update: 03/12/2021 10:08 AM    Weight  62.1 kg (137 lb)             CONSTITUTIONAL: Well developed, and nourished, appropriately responsive and aware without distress.   EYES: Sclera non-icteric.   EARS, NOSE, MOUTH AND THROAT: Mask worn.    Hearing is intact to voice.  NECK: Trachea is midline, and there is no jugular venous distension.  LYMPH NODES:  Lymph nodes in the neck are not enlarged. RESPIRATORY:  Lungs are clear, and breath sounds are equal bilaterally. Normal respiratory effort without pathologic use of accessory muscles. CARDIOVASCULAR: Heart is regular in rate and rhythm. GI: The abdomen is soft, nontender, and nondistended. There were no palpable masses. I  did not appreciate hepatosplenomegaly. There were normal bowel sounds. GU: Both testes are descended.  There is a fullness on Valsalva at the external ring consistent with left inguinal hernia, no appreciable fullness on the right. MUSCULOSKELETAL:  Symmetrical muscle tone appreciated in all four extremities.    SKIN: Skin turgor is normal. No pathologic skin lesions appreciated.  NEUROLOGIC:  Motor and sensation appear grossly normal.  Cranial nerves are grossly without defect. PSYCH:  Alert and oriented to person, place and time. Affect is appropriate for situation.  Data Reviewed I have personally reviewed what is currently available of the patient's imaging, recent labs and medical records.   Labs:  CBC Latest Ref  Rng & Units 08/19/2019 02/24/2018 02/06/2017  WBC 3.4 - 10.8 x10E3/uL 7.5 18.4(H) 8.2  Hemoglobin 13.0 - 17.7 g/dL 14.5 14.9 13.9  Hematocrit 37.5 - 51.0 % 43.3 45.7 43.3  Platelets 150 - 450 x10E3/uL 245 259 214   CMP Latest Ref Rng & Units 08/19/2019 02/24/2018 02/06/2017  Glucose 65 - 99 mg/dL 67 96 95  BUN 6 - 20 mg/dL 9 9 9   Creatinine 0.76 - 1.27 mg/dL 1.01 0.91 0.89  Sodium 134 - 144 mmol/L 139 142 138  Potassium 3.5 - 5.2 mmol/L 4.2 4.0 3.6  Chloride 96 - 106 mmol/L 100 108 102  CO2 20 - 29 mmol/L 26 26 27   Calcium 8.7 - 10.2 mg/dL 9.4 8.7(L) 8.9  Total Protein 6.0 - 8.5 g/dL 7.5 8.4(H) 7.5  Total Bilirubin 0.0 - 1.2 mg/dL 0.3 0.5 0.5  Alkaline Phos 55 - 125 IU/L 118 137(H) 130  AST 0 - 40 IU/L 18 31 21   ALT 0 - 44 IU/L 7 17 9(L)      Imaging:  Within last 24 hrs: No results found.  Assessment    Left inguinal hernia. 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 I discussed possibility of incarceration, strangulation, enlargement in size over time, and the need for emergency surgery in the face of these.  Also reviewed the techniques of reduction should incarceration occur, and when unsuccessful to present to the ED.  Also discussed that surgery risks include recurrence which can be up to 30% in the case of complex hernias, use of prosthetic materials (mesh) and the increased risk of infection and the possible need for re-operation and removal of mesh, possibility of post-op SBO or ileus, and the risks of general anesthetic including heart attack, stroke, sudden death or some reaction to anesthetic medications. The patient, and those present, appear to understand the risks, any and all questions were answered to the patient's satisfaction.  No guarantees were ever expressed or implied.   Face-to-face time  spent with the patient and accompanying care providers(if present) was 30 minutes, with more than 50% of the time spent counseling, educating, and coordinating care of the patient.    These notes generated with voice recognition software. I apologize for typographical errors.  Ronny Bacon M.D., FACS 03/12/2021, 10:37 AM

## 2021-03-12 NOTE — H&P (View-Only) (Signed)
Patient ID: Travis Love, male   DOB: 05/09/2000, 21 y.o.   MRN: [id number]  Chief Complaint: Left inguinal hernia  History of Present Illness Travis Love is a 20 y.o. male with left groin pain beginning about 3 months ago.  Reports an associated bulge, pain is exacerbated by prolonged standing, and coughing.  He does not do any heavy lifting at work.  Pain seems to be relieved by intercourse.  Reports an associated varicosity of the spermatic vein.  Denies any difficulty voiding.  Had missed work due to the onset of the pain beginning January 6.  Past Medical History Past Medical History:  Diagnosis Date   Hernia of scrotum       Past Surgical History:  Procedure Laterality Date   NO PAST SURGERIES     WISDOM TOOTH EXTRACTION      Allergies  Allergen Reactions   Dust Mite Extract    Johnson Grass    Other Itching   Pollen Extract    Shellfish Allergy     Current Outpatient Medications  Medication Sig Dispense Refill   EPIPEN 2-PAK 0.3 MG/0.3ML SOAJ injection use as directed by prescriber  FOR SYSTEMIC REACTION INJECTION 4 DAYS  0   ondansetron (ZOFRAN-ODT) 4 MG disintegrating tablet Take 1 tablet (4 mg total) by mouth every 8 (eight) hours as needed for nausea or vomiting. 20 tablet 0   tamsulosin (FLOMAX) 0.4 MG CAPS capsule Take 1 capsule (0.4 mg total) by mouth daily after breakfast. 14 capsule 0   No current facility-administered medications for this visit.    Family History Family History  Problem Relation Age of Onset   Headache Mother    Healthy Father    Healthy Sister    Hyperlipidemia Paternal Grandmother    CVA Maternal Grandfather    Diabetes Maternal Grandfather       Social History Social History   Tobacco Use   Smoking status: Some Days    Packs/day: 0.50    Types: Cigarettes   Smokeless tobacco: Never  Vaping Use   Vaping Use: Never used  Substance Use Topics   Alcohol use: Yes    Alcohol/week: 0.0 standard drinks   Drug use: Yes     Types: Marijuana        Review of Systems  Constitutional: Negative.   HENT: Negative.    Eyes: Negative.   Respiratory: Negative.    Cardiovascular: Negative.   Gastrointestinal: Negative.   Genitourinary: Negative.   Skin: Negative.   Neurological: Negative.   Psychiatric/Behavioral: Negative.       Physical Exam Blood pressure 128/79, pulse 82, temperature 97.8 F (36.6 C), temperature source Oral, height 5\' 10"  (1.778 m), weight 137 lb (62.1 kg), SpO2 98 %. Last Weight  Most recent update: 03/12/2021 10:08 AM    Weight  62.1 kg (137 lb)             CONSTITUTIONAL: Well developed, and nourished, appropriately responsive and aware without distress.   EYES: Sclera non-icteric.   EARS, NOSE, MOUTH AND THROAT: Mask worn.    Hearing is intact to voice.  NECK: Trachea is midline, and there is no jugular venous distension.  LYMPH NODES:  Lymph nodes in the neck are not enlarged. RESPIRATORY:  Lungs are clear, and breath sounds are equal bilaterally. Normal respiratory effort without pathologic use of accessory muscles. CARDIOVASCULAR: Heart is regular in rate and rhythm. GI: The abdomen is soft, nontender, and nondistended. There were no palpable masses. I  did not appreciate hepatosplenomegaly. There were normal bowel sounds. GU: Both testes are descended.  There is a fullness on Valsalva at the external ring consistent with left inguinal hernia, no appreciable fullness on the right. MUSCULOSKELETAL:  Symmetrical muscle tone appreciated in all four extremities.    SKIN: Skin turgor is normal. No pathologic skin lesions appreciated.  NEUROLOGIC:  Motor and sensation appear grossly normal.  Cranial nerves are grossly without defect. PSYCH:  Alert and oriented to person, place and time. Affect is appropriate for situation.  Data Reviewed I have personally reviewed what is currently available of the patient's imaging, recent labs and medical records.   Labs:  CBC Latest Ref  Rng & Units 08/19/2019 02/24/2018 02/06/2017  WBC 3.4 - 10.8 x10E3/uL 7.5 18.4(H) 8.2  Hemoglobin 13.0 - 17.7 g/dL 14.5 14.9 13.9  Hematocrit 37.5 - 51.0 % 43.3 45.7 43.3  Platelets 150 - 450 x10E3/uL 245 259 214   CMP Latest Ref Rng & Units 08/19/2019 02/24/2018 02/06/2017  Glucose 65 - 99 mg/dL 67 96 95  BUN 6 - 20 mg/dL 9 9 9   Creatinine 0.76 - 1.27 mg/dL 1.01 0.91 0.89  Sodium 134 - 144 mmol/L 139 142 138  Potassium 3.5 - 5.2 mmol/L 4.2 4.0 3.6  Chloride 96 - 106 mmol/L 100 108 102  CO2 20 - 29 mmol/L 26 26 27   Calcium 8.7 - 10.2 mg/dL 9.4 8.7(L) 8.9  Total Protein 6.0 - 8.5 g/dL 7.5 8.4(H) 7.5  Total Bilirubin 0.0 - 1.2 mg/dL 0.3 0.5 0.5  Alkaline Phos 55 - 125 IU/L 118 137(H) 130  AST 0 - 40 IU/L 18 31 21   ALT 0 - 44 IU/L 7 17 9(L)      Imaging:  Within last 24 hrs: No results found.  Assessment    Left inguinal hernia. 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 I discussed possibility of incarceration, strangulation, enlargement in size over time, and the need for emergency surgery in the face of these.  Also reviewed the techniques of reduction should incarceration occur, and when unsuccessful to present to the ED.  Also discussed that surgery risks include recurrence which can be up to 30% in the case of complex hernias, use of prosthetic materials (mesh) and the increased risk of infection and the possible need for re-operation and removal of mesh, possibility of post-op SBO or ileus, and the risks of general anesthetic including heart attack, stroke, sudden death or some reaction to anesthetic medications. The patient, and those present, appear to understand the risks, any and all questions were answered to the patient's satisfaction.  No guarantees were ever expressed or implied.   Face-to-face time  spent with the patient and accompanying care providers(if present) was 30 minutes, with more than 50% of the time spent counseling, educating, and coordinating care of the patient.    These notes generated with voice recognition software. I apologize for typographical errors.  Ronny Bacon M.D., FACS 03/12/2021, 10:37 AM

## 2021-03-12 NOTE — Patient Instructions (Addendum)
Our surgery scheduler Barbara will call you within 24-48 hours to get you scheduled. If you have not heard from her after 48 hours, please call our office. You will not need to get Covid tested before surgery and have the blue sheet available when she calls to write down important information.  If you have any concerns or questions, please feel free to call our office.   Inguinal Hernia, Adult An inguinal hernia is when fat or your intestines push through a weak spot in a muscle where your leg meets your lower belly (groin). This causes a bulge. This kind of hernia could also be: In your scrotum, if you are male. In folds of skin around your vagina, if you are male. There are three types of inguinal hernias: Hernias that can be pushed back into the belly (are reducible). This type rarely causes pain. Hernias that cannot be pushed back into the belly (are incarcerated). Hernias that cannot be pushed back into the belly and lose their blood supply (are strangulated). This type needs emergency surgery. What are the causes? This condition is caused by having a weak spot in the muscles or tissues in your groin. This develops over time. The hernia may poke through the weak spot when you strain your lower belly muscles all of a sudden, such as when you: Lift a heavy object. Strain to poop (have a bowel movement). Trouble pooping (constipation) can lead to straining. Cough. What increases the risk? This condition is more likely to develop in: Males. Pregnant females. People who: Are overweight. Work in jobs that require long periods of standing or heavy lifting. Have had an inguinal hernia before. Smoke or have lung disease. These factors can lead to long-term (chronic) coughing. What are the signs or symptoms? Symptoms may depend on the size of the hernia. Often, a small hernia has no symptoms. Symptoms of a larger hernia may include: A bulge in the groin area. This is easier to see when  standing. You might not be able to see it when you are lying down. Pain or burning in the groin. This may get worse when you lift, strain, or cough. A dull ache or a feeling of pressure in the groin. An abnormal bulge in the scrotum, in males. Symptoms of a strangulated inguinal hernia may include: A bulge in your groin that is very painful and tender to the touch. A bulge that turns red or purple. Fever, feeling like you may vomit (nausea), and vomiting. Not being able to poop or to pass gas. How is this treated? Treatment depends on the size of your hernia and whether you have symptoms. If you do not have symptoms, your doctor may have you watch your hernia carefully and have you come in for follow-up visits. If your hernia is large or if you have symptoms, you may need surgery to repair the hernia. Follow these instructions at home: Lifestyle Avoid lifting heavy objects. Avoid standing for long amounts of time. Do not smoke or use any products that contain nicotine or tobacco. If you need help quitting, ask your doctor. Stay at a healthy weight. Prevent trouble pooping You may need to take these actions to prevent or treat trouble pooping: Drink enough fluid to keep your pee (urine) pale yellow. Take over-the-counter or prescription medicines. Eat foods that are high in fiber. These include beans, whole grains, and fresh fruits and vegetables. Limit foods that are high in fat and sugar. These include fried or sweet foods. General   instructions You may try to push your hernia back in place by very gently pressing on it when you are lying down. Do not try to push the bulge back in if it will not go in easily. Watch your hernia for any changes in shape, size, or color. Tell your doctor if you see any changes. Take over-the-counter and prescription medicines only as told by your doctor. Keep all follow-up visits. Contact a doctor if: You have a fever or chills. You have new  symptoms. Your symptoms get worse. Get help right away if: You have pain in your groin that gets worse all of a sudden. You have a bulge in your groin that: Gets bigger all of a sudden, and it does not get smaller after that. Turns red or purple. Is painful when you touch it. You are a male, and you have: Sudden pain in your scrotum. A sudden change in the size of your scrotum. You cannot push the hernia back in place by very gently pressing on it when you are lying down. You feel like you may vomit, and that feeling does not go away. You keep vomiting. You have a fast heartbeat. You cannot poop or pass gas. These symptoms may be an emergency. Get help right away. Call your local emergency services (911 in the [place] Do not wait to see if the symptoms will go away. Do not drive yourself to the hospital. Summary An inguinal hernia is when fat or your intestines push through a weak spot in a muscle where your leg meets your lower belly (groin). This causes a bulge. If you do not have symptoms, you may not need treatment. If you have symptoms or a large hernia, you may need surgery. Avoid lifting heavy objects. Also, avoid standing for long amounts of time. Do not try to push the bulge back in if it will not go in easily. This information is not intended to replace advice given to you by your health care provider. Make sure you discuss any questions you have with your health care provider. Document Revised: 10/11/2019 Document Reviewed: 10/11/2019 Elsevier Patient Education  2022 [employer] Inc.  

## 2021-03-13 ENCOUNTER — Telehealth: Payer: Self-pay | Admitting: Surgery

## 2021-03-13 NOTE — Telephone Encounter (Signed)
Outgoing call is made, left message for patient to call.  Please inform of the following:   Pre-Admission date/time, COVID Testing date and Surgery date.  Surgery Date: 03/20/21 Preadmission Testing Date: 03/18/21 (phone 7:30 am -1:00 pm) Covid Testing Date: Not needed.     Also patient will need to call at [phone], between 1-3:00pm the day before surgery, to find out what time to arrive for surgery.

## 2021-03-13 NOTE — Telephone Encounter (Signed)
Received call back from patient's mom, Maxine Glenn, they are now informed of all dates regarding surgery and verbalized understanding.

## 2021-03-18 ENCOUNTER — Other Ambulatory Visit: Payer: Self-pay

## 2021-03-18 ENCOUNTER — Other Ambulatory Visit
Admission: RE | Admit: 2021-03-18 | Discharge: 2021-03-18 | Disposition: A | Discharge status: Home / Self Care | Payer: Medicaid Other | Source: Ambulatory Visit | Attending: Surgery | Admitting: Surgery

## 2021-03-18 NOTE — Patient Instructions (Addendum)
Your procedure is scheduled on: 03/20/21 - Wednesday Report to the Registration Desk on the 1st floor of the [facility]. To find out your arrival time, please call [phone] between 1PM - 3PM on: 03/19/21 - Tuesday  REMEMBER: Instructions that are not followed completely may result in serious medical risk, up to and including death; or upon the discretion of your surgeon and anesthesiologist your surgery may need to be rescheduled.  Do not eat food or drink any fluids after midnight the night before surgery.  No gum chewing, lozengers or hard candies.  TAKE THESE MEDICATIONS THE MORNING OF SURGERY WITH A SIP OF WATER: NONE  One week prior to surgery: Stop Anti-inflammatories (NSAIDS) such as Advil, Aleve, Ibuprofen, Motrin, Naproxen, Naprosyn and Aspirin based products such as Excedrin, Goodys Powder, BC Powder.  Stop ANY OVER THE COUNTER supplements until after surgery.  You may however, continue to take Tylenol if needed for pain up until the day of surgery.  No Alcohol for 24 hours before or after surgery.  No Smoking including e-cigarettes for 24 hours prior to surgery.  No chewable tobacco products for at least 6 hours prior to surgery.  No nicotine patches on the day of surgery.  Do not use any "recreational" drugs for at least a week prior to your surgery.  Please be advised that the combination of cocaine and anesthesia may have negative outcomes, up to and including death. If you test positive for cocaine, your surgery will be cancelled.  On the morning of surgery brush your teeth with toothpaste and water, you may rinse your mouth with mouthwash if you wish. Do not swallow any toothpaste or mouthwash.  Use CHG Soap or wipes as directed on instruction sheet.  Do not wear jewelry, make-up, hairpins, clips or nail polish.  Do not wear lotions, powders, or perfumes.   Do not shave body from the neck down 48 hours prior to surgery just in case you cut yourself which  could leave a site for infection.  Also, freshly shaved skin may become irritated if using the CHG soap.  Contact lenses, hearing aids and dentures may not be worn into surgery.  Do not bring valuables to the hospital. [facility] is not responsible for any missing/lost belongings or valuables.   Notify your doctor if there is any change in your medical condition (cold, fever, infection).  Wear comfortable clothing (specific to your surgery type) to the hospital.  After surgery, you can help prevent lung complications by doing breathing exercises.  Take deep breaths and cough every 1-2 hours. Your doctor may order a device called an Incentive Spirometer to help you take deep breaths. When coughing or sneezing, hold a pillow firmly against your incision with both hands. This is called splinting. Doing this helps protect your incision. It also decreases belly discomfort.  If you are being admitted to the hospital overnight, leave your suitcase in the car. After surgery it may be brought to your room.  If you are being discharged the day of surgery, you will not be allowed to drive home. You will need a responsible adult (18 years or older) to drive you home and stay with you that night.   If you are taking public transportation, you will need to have a responsible adult (18 years or older) with you. Please confirm with your physician that it is acceptable to use public transportation.   Please call the [facility] Dept. at [phone] if you have any  questions about these instructions.  Surgery Visitation Policy:  Patients undergoing a surgery or procedure may have one family member or support person with them as long as that person is not COVID-19 positive or experiencing its symptoms.  That person may remain in the waiting area during the procedure and may rotate out with other people.  Inpatient Visitation:    Visiting hours are 7 a.m. to 8 p.m. Up to two visitors  ages 16+ are allowed at one time in a patient room. The visitors may rotate out with other people during the day. Visitors must check out when they leave, or other visitors will not be allowed. One designated support person may remain overnight. The visitor must pass COVID-19 screenings, use hand sanitizer when entering and exiting the patients room and wear a mask at all times, including in the patients room. Patients must also wear a mask when staff or their visitor are in the room. Masking is required regardless of vaccination status.

## 2021-03-19 ENCOUNTER — Encounter: Payer: Self-pay | Admitting: Urgent Care

## 2021-03-19 ENCOUNTER — Other Ambulatory Visit
Admission: RE | Admit: 2021-03-19 | Discharge: 2021-03-19 | Disposition: A | Discharge status: Home / Self Care | Payer: Medicaid Other | Source: Ambulatory Visit | Attending: Surgery | Admitting: Surgery

## 2021-03-19 DIAGNOSIS — K4090 Unilateral inguinal hernia, without obstruction or gangrene, not specified as recurrent: Secondary | ICD-10-CM | POA: Diagnosis not present

## 2021-03-19 DIAGNOSIS — Z01812 Encounter for preprocedural laboratory examination: Secondary | ICD-10-CM | POA: Diagnosis present

## 2021-03-19 LAB — CBC WITH DIFFERENTIAL/PLATELET
Abs Immature Granulocytes: 0.02 10*3/uL (ref 0.00–0.07)
Basophils Absolute: 0.1 10*3/uL (ref 0.0–0.1)
Basophils Relative: 1 %
Eosinophils Absolute: 0.1 10*3/uL (ref 0.0–0.5)
Eosinophils Relative: 1 %
HCT: 43.5 % (ref 39.0–52.0)
Hemoglobin: 14.2 g/dL (ref 13.0–17.0)
Immature Granulocytes: 0 %
Lymphocytes Relative: 28 %
Lymphs Abs: 2 10*3/uL (ref 0.7–4.0)
MCH: 29.3 pg (ref 26.0–34.0)
MCHC: 32.6 g/dL (ref 30.0–36.0)
MCV: 89.9 fL (ref 80.0–100.0)
Monocytes Absolute: 0.7 10*3/uL (ref 0.1–1.0)
Monocytes Relative: 9 %
Neutro Abs: 4.4 10*3/uL (ref 1.7–7.7)
Neutrophils Relative %: 61 %
Platelets: 261 10*3/uL (ref 150–400)
RBC: 4.84 MIL/uL (ref 4.22–5.81)
RDW: 13.3 % (ref 11.5–15.5)
WBC: 7.3 10*3/uL (ref 4.0–10.5)
nRBC: 0 % (ref 0.0–0.2)

## 2021-03-19 LAB — COMPREHENSIVE METABOLIC PANEL
ALT: 11 U/L (ref 0–44)
AST: 18 U/L (ref 15–41)
Albumin: 4.3 g/dL (ref 3.5–5.0)
Alkaline Phosphatase: 69 U/L (ref 38–126)
Anion gap: 8 (ref 5–15)
BUN: 11 mg/dL (ref 6–20)
CO2: 28 mmol/L (ref 22–32)
Calcium: 9.4 mg/dL (ref 8.9–10.3)
Chloride: 100 mmol/L (ref 98–111)
Creatinine, Ser: 0.93 mg/dL (ref 0.61–1.24)
GFR, Estimated: >60 mL/min (ref >60)
Glucose, Bld: 97 mg/dL (ref 70–99)
Potassium: 4 mmol/L (ref 3.5–5.1)
Sodium: 136 mmol/L (ref 135–145)
Total Bilirubin: 0.6 mg/dL (ref 0.3–1.2)
Total Protein: 7.4 g/dL (ref 6.5–8.1)

## 2021-03-20 ENCOUNTER — Other Ambulatory Visit: Payer: Self-pay

## 2021-03-20 ENCOUNTER — Encounter
Admission: RE | Disposition: A | Discharge status: Home / Self Care | Payer: Self-pay | Source: Home / Self Care | Attending: Surgery

## 2021-03-20 ENCOUNTER — Ambulatory Visit: Payer: Medicaid Other | Admitting: Anesthesiology

## 2021-03-20 ENCOUNTER — Ambulatory Visit
Admission: RE | Admit: 2021-03-20 | Discharge: 2021-03-20 | Disposition: A | Discharge status: Home / Self Care | Payer: Medicaid Other | Attending: Surgery | Admitting: Surgery

## 2021-03-20 ENCOUNTER — Encounter: Payer: Self-pay | Admitting: Surgery

## 2021-03-20 DIAGNOSIS — F17210 Nicotine dependence, cigarettes, uncomplicated: Secondary | ICD-10-CM | POA: Insufficient documentation

## 2021-03-20 DIAGNOSIS — K4090 Unilateral inguinal hernia, without obstruction or gangrene, not specified as recurrent: Secondary | ICD-10-CM | POA: Insufficient documentation

## 2021-03-20 SURGERY — HERNIORRHAPHY, INGUINAL, ROBOT-ASSISTED, LAPAROSCOPIC
Anesthesia: General | Laterality: Left

## 2021-03-20 MED ORDER — IBUPROFEN 800 MG PO TABS: 800.0000 mg | ORAL_TABLET | Freq: Three times a day (TID) | ORAL | 0 refills | Status: DC | PRN

## 2021-03-20 MED ORDER — CEFAZOLIN SODIUM-DEXTROSE 2-4 GM/100ML-% IV SOLN
INTRAVENOUS | Status: AC
  Filled 2021-03-20: qty 100

## 2021-03-20 MED ORDER — LACTATED RINGERS IV SOLN: INTRAVENOUS | Status: DC

## 2021-03-20 MED ORDER — GABAPENTIN 300 MG PO CAPS: 300.0000 mg | ORAL_CAPSULE | ORAL | Status: AC

## 2021-03-20 MED ORDER — PROMETHAZINE HCL 25 MG/ML IJ SOLN: 6.2500 mg | INTRAMUSCULAR | Status: DC | PRN

## 2021-03-20 MED ORDER — OXYCODONE HCL 5 MG PO TABS
5.0000 mg | ORAL_TABLET | Freq: Once | ORAL | Status: AC
  Administered 2021-03-20: 16:00:00 5 mg via ORAL

## 2021-03-20 MED ORDER — BUPIVACAINE-EPINEPHRINE (PF) 0.25% -1:200000 IJ SOLN
INTRAMUSCULAR | Status: DC | PRN
  Administered 2021-03-20: 10 mL via PERINEURAL
  Administered 2021-03-20: 5 mL

## 2021-03-20 MED ORDER — DEXAMETHASONE SODIUM PHOSPHATE 10 MG/ML IJ SOLN
INTRAMUSCULAR | Status: DC | PRN
  Administered 2021-03-20: 10 mg via INTRAVENOUS

## 2021-03-20 MED ORDER — ACETAMINOPHEN 500 MG PO TABS: 1000.0000 mg | ORAL_TABLET | ORAL | Status: AC

## 2021-03-20 MED ORDER — BUPIVACAINE LIPOSOME 1.3 % IJ SUSP: 20.0000 mL | Freq: Once | INTRAMUSCULAR | Status: DC

## 2021-03-20 MED ORDER — FENTANYL CITRATE (PF) 100 MCG/2ML IJ SOLN
INTRAMUSCULAR | Status: AC
  Filled 2021-03-20: qty 2

## 2021-03-20 MED ORDER — PROMETHAZINE HCL 25 MG/ML IJ SOLN
INTRAMUSCULAR | Status: AC
  Administered 2021-03-20: 16:00:00 12.5 mg via INTRAVENOUS
  Filled 2021-03-20: qty 1

## 2021-03-20 MED ORDER — 0.9 % SODIUM CHLORIDE (POUR BTL) OPTIME
TOPICAL | Status: DC | PRN
  Administered 2021-03-20: 15:00:00 500 mL

## 2021-03-20 MED ORDER — FENTANYL CITRATE (PF) 100 MCG/2ML IJ SOLN
25.0000 ug | INTRAMUSCULAR | Status: DC | PRN
  Administered 2021-03-20: 16:00:00 50 ug via INTRAVENOUS

## 2021-03-20 MED ORDER — SODIUM CHLORIDE FLUSH 0.9 % IV SOLN
INTRAVENOUS | Status: AC
  Filled 2021-03-20: qty 10

## 2021-03-20 MED ORDER — PROPOFOL 10 MG/ML IV BOLUS
INTRAVENOUS | Status: DC | PRN
Start: 2021-03-20 — End: 2021-03-20
  Administered 2021-03-20: 200 mg via INTRAVENOUS

## 2021-03-20 MED ORDER — LIDOCAINE HCL (CARDIAC) PF 100 MG/5ML IV SOSY
PREFILLED_SYRINGE | INTRAVENOUS | Status: DC | PRN
  Administered 2021-03-20: 100 mg via INTRAVENOUS

## 2021-03-20 MED ORDER — SUGAMMADEX SODIUM 200 MG/2ML IV SOLN
INTRAVENOUS | Status: DC | PRN
  Administered 2021-03-20: 200 mg via INTRAVENOUS

## 2021-03-20 MED ORDER — CHLORHEXIDINE GLUCONATE CLOTH 2 % EX PADS: 6.0000 | MEDICATED_PAD | Freq: Once | CUTANEOUS | Status: DC

## 2021-03-20 MED ORDER — BUPIVACAINE-EPINEPHRINE (PF) 0.25% -1:200000 IJ SOLN
INTRAMUSCULAR | Status: AC
  Filled 2021-03-20: qty 30

## 2021-03-20 MED ORDER — FENTANYL CITRATE (PF) 100 MCG/2ML IJ SOLN
INTRAMUSCULAR | Status: AC
  Administered 2021-03-20: 16:00:00 50 ug via INTRAVENOUS
  Filled 2021-03-20: qty 2

## 2021-03-20 MED ORDER — HYDROCODONE-ACETAMINOPHEN 5-325 MG PO TABS: 1.0000 | ORAL_TABLET | Freq: Four times a day (QID) | ORAL | 0 refills | Status: DC | PRN

## 2021-03-20 MED ORDER — LACTATED RINGERS IV SOLN: INTRAVENOUS | Status: DC | PRN

## 2021-03-20 MED ORDER — OXYCODONE HCL 5 MG PO TABS
ORAL_TABLET | ORAL | Status: AC
  Filled 2021-03-20: qty 1

## 2021-03-20 MED ORDER — MIDAZOLAM HCL 2 MG/2ML IJ SOLN
INTRAMUSCULAR | Status: DC | PRN
  Administered 2021-03-20: 2 mg via INTRAVENOUS

## 2021-03-20 MED ORDER — ONDANSETRON HCL 4 MG/2ML IJ SOLN
INTRAMUSCULAR | Status: DC | PRN
  Administered 2021-03-20: 4 mg via INTRAVENOUS

## 2021-03-20 MED ORDER — ONDANSETRON HCL 4 MG/2ML IJ SOLN
INTRAMUSCULAR | Status: AC
  Filled 2021-03-20: qty 2

## 2021-03-20 MED ORDER — CHLORHEXIDINE GLUCONATE 0.12 % MT SOLN
OROMUCOSAL | Status: AC
  Administered 2021-03-20: 11:00:00 15 mL via OROMUCOSAL
  Filled 2021-03-20: qty 15

## 2021-03-20 MED ORDER — ACETAMINOPHEN 500 MG PO TABS
ORAL_TABLET | ORAL | Status: AC
  Administered 2021-03-20: 11:00:00 1000 mg via ORAL
  Filled 2021-03-20: qty 2

## 2021-03-20 MED ORDER — ORAL CARE MOUTH RINSE: 15.0000 mL | Freq: Once | OROMUCOSAL | Status: AC

## 2021-03-20 MED ORDER — MIDAZOLAM HCL 2 MG/2ML IJ SOLN
INTRAMUSCULAR | Status: AC
  Filled 2021-03-20: qty 2

## 2021-03-20 MED ORDER — GABAPENTIN 300 MG PO CAPS
ORAL_CAPSULE | ORAL | Status: AC
  Administered 2021-03-20: 11:00:00 300 mg via ORAL
  Filled 2021-03-20: qty 1

## 2021-03-20 MED ORDER — ROCURONIUM BROMIDE 100 MG/10ML IV SOLN
INTRAVENOUS | Status: DC | PRN
  Administered 2021-03-20: 50 mg via INTRAVENOUS

## 2021-03-20 MED ORDER — CHLORHEXIDINE GLUCONATE 0.12 % MT SOLN: 15.0000 mL | Freq: Once | OROMUCOSAL | Status: AC

## 2021-03-20 MED ORDER — CEFAZOLIN SODIUM-DEXTROSE 2-4 GM/100ML-% IV SOLN
2.0000 g | INTRAVENOUS | Status: AC
  Administered 2021-03-20: 14:00:00 2 g via INTRAVENOUS

## 2021-03-20 MED ORDER — LIDOCAINE HCL (PF) 2 % IJ SOLN
INTRAMUSCULAR | Status: AC
  Filled 2021-03-20: qty 5

## 2021-03-20 MED ORDER — CELECOXIB 200 MG PO CAPS
ORAL_CAPSULE | ORAL | Status: AC
  Administered 2021-03-20: 11:00:00 200 mg via ORAL
  Filled 2021-03-20: qty 1

## 2021-03-20 MED ORDER — CELECOXIB 200 MG PO CAPS: 200.0000 mg | ORAL_CAPSULE | ORAL | Status: AC

## 2021-03-20 SURGICAL SUPPLY — 46 items
BLADE CLIPPER SURG (BLADE) ×2 IMPLANT
CANNULA CAP OBTURATR AIRSEAL 8 (CAP) ×2 IMPLANT
CHLORAPREP W/TINT 26 (MISCELLANEOUS) ×2 IMPLANT
COVER TIP SHEARS 8 DVNC (MISCELLANEOUS) ×1 IMPLANT
COVER TIP SHEARS 8MM DA VINCI (MISCELLANEOUS) ×1
COVER WAND RF STERILE (DRAPES) ×2 IMPLANT
DEFOGGER SCOPE WARMER CLEARIFY (MISCELLANEOUS) ×2 IMPLANT
DERMABOND ADVANCED (GAUZE/BANDAGES/DRESSINGS) ×1
DERMABOND ADVANCED .7 DNX12 (GAUZE/BANDAGES/DRESSINGS) ×1 IMPLANT
DRAPE 3/4 80X56 (DRAPES) ×2 IMPLANT
DRAPE ARM DVNC X/XI (DISPOSABLE) ×3 IMPLANT
DRAPE COLUMN DVNC XI (DISPOSABLE) ×1 IMPLANT
DRAPE DA VINCI XI ARM (DISPOSABLE) ×3
DRAPE DA VINCI XI COLUMN (DISPOSABLE) ×1
ELECT REM PT RETURN 9FT ADLT (ELECTROSURGICAL) ×2
ELECTRODE REM PT RTRN 9FT ADLT (ELECTROSURGICAL) ×1 IMPLANT
GLOVE SURG ORTHO LTX SZ7.5 (GLOVE) ×6 IMPLANT
GOWN STRL REUS W/ TWL LRG LVL3 (GOWN DISPOSABLE) ×3 IMPLANT
GOWN STRL REUS W/TWL LRG LVL3 (GOWN DISPOSABLE) ×3
GRASPER SUT TROCAR 14GX15 (MISCELLANEOUS) IMPLANT
IRRIGATION STRYKERFLOW (MISCELLANEOUS) IMPLANT
IRRIGATOR STRYKERFLOW (MISCELLANEOUS)
IV CATH ANGIO 14GX1.88 NO SAFE (IV SOLUTION) ×2 IMPLANT
IV NS 1000ML (IV SOLUTION)
IV NS 1000ML BAXH (IV SOLUTION) IMPLANT
KIT PINK PAD W/HEAD ARE REST (MISCELLANEOUS) ×2
KIT PINK PAD W/HEAD ARM REST (MISCELLANEOUS) ×1 IMPLANT
LABEL OR SOLS (LABEL) ×2 IMPLANT
MANIFOLD NEPTUNE II (INSTRUMENTS) ×2 IMPLANT
MESH 3DMAX LIGHT 4.1X6.2 LT LR (Mesh General) ×1 IMPLANT
NDL INSUFFLATION 14GA 120MM (NEEDLE) IMPLANT
NEEDLE HYPO 22GX1.5 SAFETY (NEEDLE) ×2 IMPLANT
NEEDLE INSUFFLATION 14GA 120MM (NEEDLE) IMPLANT
PACK LAP CHOLECYSTECTOMY (MISCELLANEOUS) ×2 IMPLANT
SEAL CANN UNIV 5-8 DVNC XI (MISCELLANEOUS) ×2 IMPLANT
SEAL XI 5MM-8MM UNIVERSAL (MISCELLANEOUS) ×2
SET TUBE FILTERED XL AIRSEAL (SET/KITS/TRAYS/PACK) ×2 IMPLANT
SOLUTION ELECTROLUBE (MISCELLANEOUS) ×2 IMPLANT
SUT MNCRL 4-0 (SUTURE) ×1
SUT MNCRL 4-0 27XMFL (SUTURE) ×1
SUT VIC AB 0 CT2 27 (SUTURE) ×2 IMPLANT
SUT VLOC 90 2/L VL 12 GS22 (SUTURE) IMPLANT
SUT VLOC 90 S/L VL9 GS22 (SUTURE) ×2 IMPLANT
SUTURE MNCRL 4-0 27XMF (SUTURE) ×1 IMPLANT
TROCAR Z-THREAD FIOS 11X100 BL (TROCAR) IMPLANT
WATER STERILE IRR 500ML POUR (IV SOLUTION) ×2 IMPLANT

## 2021-03-20 NOTE — Op Note (Signed)
Robotic assisted Laparoscopic Transabdominal left inguinal Hernia Repair with Mesh       Pre-operative Diagnosis: Left inguinal Hernia   Post-operative Diagnosis: Same   Procedure: Robotic assisted Laparoscopic  repair of left direct inguinal hernia(s)   Surgeon: Ronny Bacon, M.D., FACS   Anesthesia: GETA   Findings: Left direct inguinal hernia, no evidence of right sided hernia.         Procedure Details  The patient was seen again in the Holding Room. The benefits, complications, treatment options, and expected outcomes were discussed with the patient. The risks of bleeding, infection, recurrence of symptoms, failure to resolve symptoms, recurrence of hernia, ischemic orchitis, chronic pain syndrome or neuroma, were reviewed again. The likelihood of improving the patient's symptoms with return to their baseline status is good.  The patient and/or family concurred with the proposed plan, giving informed consent.  The patient was taken to Operating Room, identified  and the procedure verified as Laparoscopic Inguinal Hernia Repair. Laterality confirmed.  A Time Out was held and the above information confirmed.   Prior to the induction of general anesthesia, antibiotic prophylaxis was administered. VTE prophylaxis was in place. General endotracheal anesthesia was then administered and tolerated well. After the induction, the abdomen was prepped with Chloraprep and draped in the sterile fashion. The patient was positioned in the supine position.   After local infiltration of quarter percent Marcaine with epinephrine, stab incision was made left upper quadrant.  On the left at Palmer's point, the Veress needle is passed with sensation of the layers to penetrate the abdominal wall and into the peritoneum.  Saline drop test is confirmed peritoneal placement.  Insufflation is initiated with carbon dioxide to pressures of 15 mmHg. An 8.5 mm port is placed to the left off of the midline, with  blunt tipped trocar.  Pneumoperitoneum maintained w/o HD changes using the AirSeal to pressures of 15 mm Hg with CO2. No evidence of bowel injuries.  Two 8.5 mm ports placed under direct vision in each upper quadrant. The laparoscopy revealed a left direct defect(s).   The robot was brought ot the table and docked in the standard fashion, no collision between arms was observed. Instruments were kept under direct view at all times. For left inguinal hernia repair,  I developed a peritoneal flap. The sac(s) were reduced and dissected free from adjacent structures. We preserved the vas and the vessels, and visualized them to their convergence and beyond in the retroperitoneum. Once dissection was completed a large left sided BARD 3D Light mesh was placed and secured at three points with interrupted 0 Vicryl to the pubic tubercle and anteriorly. There was good coverage of the direct, indirect and femoral spaces.  Second look revealed no complications or injuries.  The flap was then closed with 2-0 V-lock suture.  Peritoneal closure without defects.  Once assuring that hemostasis was adequate, all needles/sponges removed, and the robot was undocked.  A large angiocath is placed under direct visualization in the groin to reduce trapped extraperitoneal air and confirm adequate peritoneal closure.     The ports were removed, the abdomen desulflated.  4-0 subcuticular Monocryl was used at all skin edges. Dermabond was placed.  Patient tolerated the procedure well. There were no complications. He was taken to the recovery room in stable condition.           Ronny Bacon, M.D., FACS 03/20/2021, 3:03 PM

## 2021-03-20 NOTE — Anesthesia Postprocedure Evaluation (Signed)
Anesthesia Post Note  Patient: Travis Love  Procedure(s) Performed: XI ROBOTIC ASSISTED INGUINAL HERNIA (Left)  Patient location during evaluation: PACU Anesthesia Type: General Level of consciousness: awake and alert Pain management: pain level controlled Vital Signs Assessment: post-procedure vital signs reviewed and stable Respiratory status: spontaneous breathing, nonlabored ventilation and respiratory function stable Cardiovascular status: blood pressure returned to baseline and stable Postop Assessment: no apparent nausea or vomiting Anesthetic complications: no   No notable events documented.   Last Vitals:  Vitals:   03/20/21 1615 03/20/21 1623  BP: 127/81 (P) 128/74  Pulse: 78 (P) 74  Resp: 18 (P) 16  Temp: 36.7 C (!) (P) 36.2 C  SpO2: 100% (P) 100%    Last Pain:  Vitals:   03/20/21 1623  TempSrc: (P) Temporal  PainSc: (P) [place]

## 2021-03-20 NOTE — Anesthesia Preprocedure Evaluation (Signed)
Anesthesia Evaluation  Patient identified by MRN, date of birth, ID band Patient awake    Reviewed: Allergy & Precautions, H&P , NPO status , Patient's Chart, lab work & pertinent test results, reviewed documented beta blocker date and time   History of Anesthesia Complications Negative for: history of anesthetic complications  Airway Mallampati: II  TM Distance: >3 FB Neck ROM: full    Dental  (+) Dental Advidsory Given   Pulmonary neg shortness of breath, neg COPD, neg recent URI, Current Smoker,    Pulmonary exam normal breath sounds clear to auscultation       Cardiovascular Exercise Tolerance: Good negative cardio ROS Normal cardiovascular exam Rhythm:regular Rate:Normal     Neuro/Psych negative neurological ROS  negative psych ROS   GI/Hepatic Neg liver ROS, GERD  ,  Endo/Other  negative endocrine ROS  Renal/GU negative Renal ROS  negative genitourinary   Musculoskeletal   Abdominal   Peds  Hematology negative hematology ROS (+)   Anesthesia Other Findings Past Medical History: No date: Hernia of scrotum   Reproductive/Obstetrics negative OB ROS                             Anesthesia Physical Anesthesia Plan  ASA: 2  Anesthesia Plan: General   Post-op Pain Management:    Induction: Intravenous  PONV Risk Score and Plan: 1 and Ondansetron, Dexamethasone, Midazolam, Treatment may vary due to age or medical condition and Promethazine  Airway Management Planned: Oral ETT  Additional Equipment:   Intra-op Plan:   Post-operative Plan: Extubation in OR  Informed Consent: I have reviewed the patients History and Physical, chart, labs and discussed the procedure including the risks, benefits and alternatives for the proposed anesthesia with the patient or authorized representative who has indicated his/her understanding and acceptance.     Dental Advisory Given  Plan  Discussed with: Anesthesiologist, CRNA and Surgeon  Anesthesia Plan Comments:         Anesthesia Quick Evaluation

## 2021-03-20 NOTE — Anesthesia Procedure Notes (Signed)
Procedure Name: Intubation Date/Time: 03/20/2021 1:53 PM Performed by: Philbert Riser, CRNA Pre-anesthesia Checklist: Patient identified, Patient being monitored, Timeout performed, Emergency Drugs available and Suction available Patient Re-evaluated:Patient Re-evaluated prior to induction Oxygen Delivery Method: Circle system utilized Preoxygenation: Pre-oxygenation with 100% oxygen Induction Type: IV induction Ventilation: Mask ventilation without difficulty Laryngoscope Size: 3 and McGraph Grade View: Grade I Tube type: Oral Tube size: 7.0 mm Number of attempts: 1 Airway Equipment and Method: Stylet Placement Confirmation: ETT inserted through vocal cords under direct vision, positive ETCO2 and breath sounds checked- equal and bilateral Secured at: 21 cm Tube secured with: Tape Dental Injury: Teeth and Oropharynx as per pre-operative assessment

## 2021-03-20 NOTE — Discharge Instructions (Signed)
AMBULATORY SURGERY  ?DISCHARGE INSTRUCTIONS ? ? ?The drugs that you were given will stay in your system until tomorrow so for the next 24 hours you should not: ? ?Drive an automobile ?Make any legal decisions ?Drink any alcoholic beverage ? ? ?You may resume regular meals tomorrow.  Today it is better to start with liquids and gradually work up to solid foods. ? ?You may eat anything you prefer, but it is better to start with liquids, then soup and crackers, and gradually work up to solid foods. ? ? ?Please notify your doctor immediately if you have any unusual bleeding, trouble breathing, redness and pain at the surgery site, drainage, fever, or pain not relieved by medication. ? ? ? ?Additional Instructions: ? ? ? ?Please contact your physician with any problems or Same Day Surgery at [phone], Monday through Friday 6 am to 4 pm, or [facility] [place] [facility] number at [phone].  ?

## 2021-03-20 NOTE — Interval H&P Note (Signed)
History and Physical Interval Note:  03/20/2021 11:30 AM  Travis Love  has presented today for surgery, with the diagnosis of left inguinal hernia.  The various methods of treatment have been discussed with the patient and family. After consideration of risks, benefits and other options for treatment, the patient has consented to  Procedure(s): XI ROBOTIC [place] (Left) as a surgical intervention.  The patient's history has been reviewed, patient examined, no change in status, stable for surgery.  I have reviewed the patient's chart and labs.  Questions were answered to the patient's satisfaction.   The left side is marked.   Ronny Bacon

## 2021-03-20 NOTE — Transfer of Care (Signed)
Immediate Anesthesia Transfer of Care Note  Patient: Travis Love  Procedure(s) Performed: XI ROBOTIC ASSISTED INGUINAL HERNIA (Left)  Patient Location: PACU  Anesthesia Type:General  Level of Consciousness: sedated  Airway & Oxygen Therapy: Patient Spontanous Breathing  Post-op Assessment: Report given to RN  Post vital signs: Reviewed and stable  Last Vitals:  Vitals Value Taken Time  BP 126/81 03/20/21 1506  Temp 36.7 C 03/20/21 1506  Pulse 82 03/20/21 1510  Resp 17 03/20/21 1510  SpO2 100 % 03/20/21 1510  Vitals shown include unvalidated device data.  Last Pain:  Vitals:   03/20/21 1506  TempSrc:   PainSc: Asleep         Complications: No notable events documented.

## 2021-03-25 DIAGNOSIS — F602 Antisocial personality disorder: Secondary | ICD-10-CM | POA: Diagnosis not present

## 2021-03-25 DIAGNOSIS — F1299 Cannabis use, unspecified with unspecified cannabis-induced disorder: Secondary | ICD-10-CM | POA: Diagnosis not present

## 2021-03-25 DIAGNOSIS — Z720 Tobacco use: Secondary | ICD-10-CM | POA: Diagnosis not present

## 2021-04-04 ENCOUNTER — Encounter: Payer: Self-pay | Admitting: Surgery

## 2021-04-04 ENCOUNTER — Other Ambulatory Visit: Payer: Self-pay

## 2021-04-04 ENCOUNTER — Ambulatory Visit (INDEPENDENT_AMBULATORY_CARE_PROVIDER_SITE_OTHER): Payer: Medicaid Other | Admitting: Surgery

## 2021-04-04 VITALS — BP 122/81 | HR 81 | Temp 97.9°F | Ht 70.0 in | Wt 139.2 lb

## 2021-04-04 DIAGNOSIS — Z9889 Other specified postprocedural states: Secondary | ICD-10-CM

## 2021-04-04 DIAGNOSIS — Z8719 Personal history of other diseases of the digestive system: Secondary | ICD-10-CM

## 2021-04-04 NOTE — Patient Instructions (Signed)

## 2021-04-04 NOTE — Progress Notes (Signed)
[facility] POST-OP OFFICE VISIT  04/04/2021  HPI: Travis Love is a 21 y.o. male 15 days s/p robotic left inguinal hernia repair.  He continues to sleep on his right side complaining of pain when he sleeps on his left.  Was constipated postop, no longer taking narcotics, bowel function has returned.  Currently alternating Tylenol with ibuprofen for pain control.  Reports he is not ready to return to a very physical job.  Vital signs: BP 122/81    Pulse 81    Temp 97.9 F (36.6 C) (Oral)    Ht 5\' 10"  (1.778 m)    Wt 139 lb 3.2 oz (63.1 kg)    SpO2 97%    BMI 19.97 kg/m    Physical Exam: Constitutional: He appears at his baseline. Abdomen: Nondistended, soft nontender.  Incisions are clean dry and intact, though the Dermabond is long gone. No evidence of left groin bulge, edema or swelling.  Assessment/Plan: This is a 21 y.o. male 15 days s/p robotic left inguinal hernia repair.  Progressing well.  Patient Active Problem List   Diagnosis Date Noted   Left inguinal hernia 03/12/2021   GERD (gastroesophageal reflux disease) 01/08/2015   Allergic rhinitis 01/08/2015   Hx of anaphylaxis 01/08/2015    -We discussed timing of his return to full activity and work, have a off work order that apply.  He may continue to advance his activities as tolerated.  We will be glad to see him back as needed.   01/10/2015 M.D., FACS 04/04/2021, 9:36 AM

## 2021-05-27 ENCOUNTER — Telehealth: Payer: Self-pay

## 2021-05-27 NOTE — Telephone Encounter (Signed)
Spoke with mother-son had surgery 02/2021-left inguinal hernia repair- Dr.Rodenberg-started having pain about 4 days ago. Returned to work after 6 weeks -he lifts average 20 pounds daily sometimes less.  ?

## 2021-05-30 ENCOUNTER — Encounter: Payer: Medicaid Other | Admitting: Surgery

## 2021-10-08 ENCOUNTER — Ambulatory Visit (INDEPENDENT_AMBULATORY_CARE_PROVIDER_SITE_OTHER): Payer: Medicaid Other | Admitting: Physician Assistant

## 2021-10-08 ENCOUNTER — Encounter: Payer: Self-pay | Admitting: Physician Assistant

## 2021-10-08 VITALS — BP 119/75 | HR 71 | Temp 97.9°F | Wt 134.3 lb

## 2021-10-08 DIAGNOSIS — R630 Anorexia: Secondary | ICD-10-CM | POA: Diagnosis not present

## 2021-10-08 NOTE — Assessment & Plan Note (Signed)
Advised labs, hplyori breath test Unclear etiology as unusually quick sense of fullness when sober is the only symptom Advised pt if all lab findings are negative can do a trial of PPI, but symptoms may be linked to extensive marijuana usage, recommend trial of stopping to see if symptoms resolved

## 2021-10-08 NOTE — Progress Notes (Signed)
Vivien Rota DeSanto,acting as a scribe for Alfredia Ferguson, PA-C.,have documented all relevant documentation on the behalf of Alfredia Ferguson, PA-C,as directed by  Alfredia Ferguson, PA-C while in the presence of Alfredia Ferguson, PA-C.    Established patient visit   Patient: Travis Love   DOB: 09-Dec-2000   21 y.o. Male  MRN: [id number] Visit Date: 10/08/2021  Today's healthcare provider: Alfredia Ferguson, PA-C   Chief Complaint  Patient presents with   Appetite change   Subjective    HPI  Travis Love is a 21 y/o male who presents today with his mother.  She answers many questions for him.  Reports change in appetite x 1 mo-- feels very full/nauseous even after a few bites of food even though he was hungry. Denies abdominal pain, changes in BM, bloating, reflux, chest pain, SOB.  Pt recently had to cut down on the amount of marijuana he has been smoking, now 1-2 times a day instead of many times daily. When he smokes, no symptoms are apparent and he can eat what he likes.   Medications: Outpatient Medications Prior to Visit  Medication Sig   EPIPEN 2-PAK 0.3 MG/0.3ML SOAJ injection  (Patient not taking: Reported on 10/08/2021)   [DISCONTINUED] ibuprofen (ADVIL) 800 MG tablet Take 1 tablet (800 mg total) by mouth every 8 (eight) hours as needed.   [DISCONTINUED] tamsulosin (FLOMAX) 0.4 MG CAPS capsule Take 1 capsule (0.4 mg total) by mouth daily after breakfast. (Patient not taking: Reported on 10/08/2021)   No facility-administered medications prior to visit.    Review of Systems  Constitutional:  Positive for appetite change, fatigue and unexpected weight change. Negative for chills and fever.  Respiratory:  Negative for chest tightness, shortness of breath and wheezing.   Cardiovascular:  Negative for chest pain and palpitations.  Gastrointestinal:  Negative for abdominal distention, abdominal pain, constipation, diarrhea, nausea and vomiting.     Objective    BP 119/75 (BP  Location: Left Arm, Patient Position: Sitting, Cuff Size: Normal)   Pulse 71   Temp 97.9 F (36.6 C) (Oral)   Wt 134 lb 4.8 oz (60.9 kg)   SpO2 100% Comment: room air  BMI 19.27 kg/m    Physical Exam Constitutional:      General: He is awake.     Appearance: He is well-developed.  HENT:     Head: Normocephalic.  Eyes:     Conjunctiva/sclera: Conjunctivae normal.  Cardiovascular:     Rate and Rhythm: Normal rate and regular rhythm.     Heart sounds: Normal heart sounds.  Pulmonary:     Effort: Pulmonary effort is normal.     Breath sounds: Normal breath sounds.  Abdominal:     General: Abdomen is flat. There is no distension.     Palpations: There is no mass.     Tenderness: There is no abdominal tenderness.     Hernia: No hernia is present.  Skin:    General: Skin is warm.  Neurological:     Mental Status: He is alert and oriented to person, place, and time.  Psychiatric:        Attention and Perception: Attention normal.        Mood and Affect: Mood normal.        Speech: Speech normal.        Behavior: Behavior is cooperative.      No results found for any visits on 10/08/21.  Assessment & Plan     Problem List  Items Addressed This Visit       Other   Loss of appetite - Primary    Advised labs, hplyori breath test Unclear etiology as unusually quick sense of fullness when sober is the only symptom Advised pt if all lab findings are negative can do a trial of PPI, but symptoms may be linked to extensive marijuana usage, recommend trial of stopping to see if symptoms resolved      Relevant Orders   CBC w/Diff/Platelet   Comprehensive Metabolic Panel (CMET)   H. pylori breath test    Return if symptoms worsen or fail to improve.      I, Alfredia Ferguson, PA-C have reviewed all documentation for this visit. The documentation on  10/08/2021  for the exam, diagnosis, procedures, and orders are all accurate and complete.Alfredia Ferguson, PA-C [facility] [address] [place], [place], [address] Office: [phone] Fax: [phone]   [facility]

## 2021-10-09 ENCOUNTER — Other Ambulatory Visit: Payer: Self-pay | Admitting: Physician Assistant

## 2021-10-09 DIAGNOSIS — R630 Anorexia: Secondary | ICD-10-CM

## 2021-10-09 DIAGNOSIS — R110 Nausea: Secondary | ICD-10-CM

## 2021-10-09 LAB — CBC WITH DIFFERENTIAL/PLATELET
Basophils Absolute: 0.1 10*3/uL (ref 0.0–0.2)
Basos: 1 %
EOS (ABSOLUTE): 0.2 10*3/uL (ref 0.0–0.4)
Eos: 3 %
Hematocrit: 41.5 % (ref 37.5–51.0)
Hemoglobin: 13.7 g/dL (ref 13.0–17.7)
Immature Grans (Abs): 0 10*3/uL (ref 0.0–0.1)
Immature Granulocytes: 0 %
Lymphocytes Absolute: 1.9 10*3/uL (ref 0.7–3.1)
Lymphs: 34 %
MCH: 29.7 pg (ref 26.6–33.0)
MCHC: 33 g/dL (ref 31.5–35.7)
MCV: 90 fL (ref 79–97)
Monocytes Absolute: 0.6 10*3/uL (ref 0.1–0.9)
Monocytes: 10 %
Neutrophils Absolute: 2.9 10*3/uL (ref 1.4–7.0)
Neutrophils: 52 %
Platelets: 211 10*3/uL (ref 150–450)
RBC: 4.62 x10E6/uL (ref 4.14–5.80)
RDW: 12.4 % (ref 11.6–15.4)
WBC: 5.6 10*3/uL (ref 3.4–10.8)

## 2021-10-09 LAB — COMPREHENSIVE METABOLIC PANEL
ALT: 7 IU/L (ref 0–44)
AST: 13 IU/L (ref 0–40)
Albumin/Globulin Ratio: 1.8 (ref 1.2–2.2)
Albumin: 4.4 g/dL (ref 4.3–5.2)
Alkaline Phosphatase: 90 IU/L (ref 44–121)
BUN/Creatinine Ratio: 10 (ref 9–20)
BUN: 9 mg/dL (ref 6–20)
Bilirubin Total: 0.5 mg/dL (ref 0.0–1.2)
CO2: 23 mmol/L (ref 20–29)
Calcium: 9.5 mg/dL (ref 8.7–10.2)
Chloride: 103 mmol/L (ref 96–106)
Creatinine, Ser: 0.9 mg/dL (ref 0.76–1.27)
Globulin, Total: 2.5 g/dL (ref 1.5–4.5)
Glucose: 88 mg/dL (ref 70–99)
Potassium: 4.3 mmol/L (ref 3.5–5.2)
Sodium: 140 mmol/L (ref 134–144)
Total Protein: 6.9 g/dL (ref 6.0–8.5)
eGFR: 124 mL/min/{1.73_m2} (ref >59)

## 2021-10-09 MED ORDER — PANTOPRAZOLE SODIUM 40 MG PO TBEC: 40.0000 mg | DELAYED_RELEASE_TABLET | Freq: Every day | ORAL | 1 refills | Status: DC

## 2022-12-10 ENCOUNTER — Ambulatory Visit: Payer: Self-pay | Admitting: *Deleted

## 2022-12-10 NOTE — Telephone Encounter (Signed)
  Chief Complaint: Pain in left groin area where he had a inguinal hernia repair done.   (Per chart had lap inguinal hernia repair done 03/20/2021).   Symptoms: Having the same pain in left groin like he had before having surgery for the hernia.  There is a firm feeling area in left groin. Frequency: Last night the pain was severe and brought him to tears and one other time he had severe pain. Pertinent Negatives: Patient denies blood in urine, foul smelling or cloudy urine.   Disposition: [x] ED /[] Urgent Care (no appt availability in office) / [] Appointment(In office/virtual)/ []  [facility]/ [] Home Care/ [] Refused Recommended Disposition /[] [facility]/ []  Follow-up with PCP Additional Notes: I have referred him to the ED.   He is agreeable to going.   He is a pt at [facility] but has not been seen since his provider Alfredia Ferguson, PA-C left the practice in July 2024.

## 2022-12-10 NOTE — Telephone Encounter (Signed)
Reviewed.  Agree with ED evaluation.

## 2022-12-10 NOTE — Telephone Encounter (Signed)
Reason for Disposition  [1] MODERATE pain (e.g., interferes with normal activities) AND [2] pain comes and goes (cramps) AND [3] present > 24 hours  (Exception: Pain with Vomiting or Diarrhea - see that Guideline.)  [1] SEVERE pain (e.g., excruciating) AND [2] present > 1 hour  Answer Assessment - Initial Assessment Questions 1. LOCATION: "Where does it hurt?"      I'm having pain on my left side where I had a hernia repaired years ago in my left groin area.   Now it's starting to hurt again like it did back then.      They went went in 3 different areas in his abd for the surgery.   His mother was on the line with [place] too helping answer some of the questions regarding the surgery.     He had a hernia but I don't remember what it was called per mother.     Had diarrhea yesterday.   I'm going more frequently with BMs, it's not really diarrhea and urinating more frequently.    Denies seeing blood in urine or noting it to look cloudy or have a different smell.   2. RADIATION: "Does the pain shoot anywhere else?" (e.g., chest, back)     The pain is in my left groin area   3. ONSET: "When did the pain begin?" (Minutes, hours or days ago)      2 days ago.   Last night it hurt so bad I was to tears and I have a high tolerance for pain.  His mother agreed it was bad if he was to tears because he does have a high tolerance to pain. 4. SUDDEN: "Gradual or sudden onset?" Sudden during the night last night  5. PATTERN "Does the pain come and go, or is it constant?"    - If it comes and goes: "How long does it last?" "Do you have pain now?"     (Note: Comes and goes means the pain is intermittent. It goes away completely between bouts.)    - If constant: "Is it getting better, staying the same, or getting worse?"      (Note: Constant means the pain never goes away completely; most serious pain is constant and gets worse.)      Yes when I had a hernia repair several years ago. 6. SEVERITY: "How bad is the pain?"   (e.g., Scale 1-10; mild, moderate, or severe)    - MILD (1-3): Doesn't interfere with normal activities, abdomen soft and not tender to touch.     - MODERATE (4-7): Interferes with normal activities or awakens from sleep, abdomen tender to touch.     - SEVERE (8-10): Excruciating pain, doubled over, unable to do any normal activities.       Severe last night and one other time. 7. RECURRENT SYMPTOM: "Have you ever had this type of stomach pain before?" If Yes, ask: "When was the last time?" and "What happened that time?"      Yes before the hernia surgery I had years ago No pain on right side.    8. CAUSE: "What do you think is causing the stomach pain?"     Maybe a hernia.   9. RELIEVING/AGGRAVATING FACTORS: "What makes it better or worse?" (e.g., antacids, bending or twisting motion, bowel movement)     Moving certain ways makes it hurt more 10. OTHER SYMPTOMS: "Do you have any other symptoms?" (e.g., back pain, diarrhea, fever, urination pain, vomiting)  See above  Protocols used: Abdominal Pain - Male-A-AH

## 2023-01-17 ENCOUNTER — Emergency Department
Admission: EM | Admit: 2023-01-17 | Discharge: 2023-01-17 | Disposition: A | Discharge status: Home / Self Care | Payer: BLUE CROSS/BLUE SHIELD | Attending: Emergency Medicine | Admitting: Emergency Medicine

## 2023-01-17 ENCOUNTER — Emergency Department: Payer: BLUE CROSS/BLUE SHIELD

## 2023-01-17 ENCOUNTER — Other Ambulatory Visit: Payer: Self-pay

## 2023-01-17 DIAGNOSIS — M7989 Other specified soft tissue disorders: Secondary | ICD-10-CM | POA: Insufficient documentation

## 2023-01-17 DIAGNOSIS — S62339A Displaced fracture of neck of unspecified metacarpal bone, initial encounter for closed fracture: Secondary | ICD-10-CM

## 2023-01-17 DIAGNOSIS — S62326A Displaced fracture of shaft of fifth metacarpal bone, right hand, initial encounter for closed fracture: Secondary | ICD-10-CM | POA: Diagnosis not present

## 2023-01-17 DIAGNOSIS — S6991XA Unspecified injury of right wrist, hand and finger(s), initial encounter: Secondary | ICD-10-CM | POA: Diagnosis present

## 2023-01-17 DIAGNOSIS — S62306A Unspecified fracture of fifth metacarpal bone, right hand, initial encounter for closed fracture: Secondary | ICD-10-CM | POA: Insufficient documentation

## 2023-01-17 MED ORDER — LIDOCAINE HCL (PF) 1 % IJ SOLN
5.0000 mL | Freq: Once | INTRAMUSCULAR | Status: DC
  Filled 2023-01-17: qty 5

## 2023-01-17 MED ORDER — OXYCODONE-ACETAMINOPHEN 5-325 MG PO TABS
2.0000 | ORAL_TABLET | Freq: Once | ORAL | Status: AC
  Administered 2023-01-17: 2 via ORAL
  Filled 2023-01-17: qty 2

## 2023-01-17 MED ORDER — OXYCODONE-ACETAMINOPHEN 5-325 MG PO TABS: 2.0000 | ORAL_TABLET | Freq: Three times a day (TID) | ORAL | 0 refills | Status: AC | PRN

## 2023-01-17 MED ORDER — ONDANSETRON 4 MG PO TBDP: 4.0000 mg | ORAL_TABLET | Freq: Four times a day (QID) | ORAL | 0 refills | Status: AC | PRN

## 2023-01-17 MED ORDER — ACETAMINOPHEN 500 MG PO TABS
1000.0000 mg | ORAL_TABLET | Freq: Once | ORAL | Status: AC
  Administered 2023-01-17: 1000 mg via ORAL
  Filled 2023-01-17: qty 2

## 2023-01-17 MED ORDER — ONDANSETRON 4 MG PO TBDP
4.0000 mg | ORAL_TABLET | Freq: Once | ORAL | Status: AC
Start: 2023-01-17 — End: 2023-01-17
  Administered 2023-01-17: 4 mg via ORAL
  Filled 2023-01-17: qty 1

## 2023-01-17 MED ORDER — TETANUS-DIPHTH-ACELL PERTUSSIS 5-2.5-18.5 LF-MCG/0.5 IM SUSY: 0.5000 mL | PREFILLED_SYRINGE | Freq: Once | INTRAMUSCULAR | Status: DC
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
## 2023-01-17 NOTE — Discharge Instructions (Addendum)
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
## 2023-01-17 NOTE — ED Notes (Signed)
Patient refusing head CT.

## 2023-01-20 DIAGNOSIS — S62326A Displaced fracture of shaft of fifth metacarpal bone, right hand, initial encounter for closed fracture: Secondary | ICD-10-CM | POA: Diagnosis not present

## 2023-01-20 DIAGNOSIS — S52614A Nondisplaced fracture of right ulna styloid process, initial encounter for closed fracture: Secondary | ICD-10-CM | POA: Diagnosis not present

## 2023-01-28 DIAGNOSIS — S62326A Displaced fracture of shaft of fifth metacarpal bone, right hand, initial encounter for closed fracture: Secondary | ICD-10-CM | POA: Diagnosis not present

## 2023-01-28 DIAGNOSIS — G8918 Other acute postprocedural pain: Secondary | ICD-10-CM | POA: Diagnosis not present

## 2023-02-04 DIAGNOSIS — S52611D Displaced fracture of right ulna styloid process, subsequent encounter for closed fracture with routine healing: Secondary | ICD-10-CM | POA: Diagnosis not present

## 2023-02-04 DIAGNOSIS — S62308D Unspecified fracture of other metacarpal bone, subsequent encounter for fracture with routine healing: Secondary | ICD-10-CM | POA: Diagnosis not present

## 2023-02-04 DIAGNOSIS — S52614A Nondisplaced fracture of right ulna styloid process, initial encounter for closed fracture: Secondary | ICD-10-CM | POA: Diagnosis not present

## 2023-02-20 DIAGNOSIS — S62308D Unspecified fracture of other metacarpal bone, subsequent encounter for fracture with routine healing: Secondary | ICD-10-CM | POA: Diagnosis not present

## 2023-02-20 DIAGNOSIS — S52614A Nondisplaced fracture of right ulna styloid process, initial encounter for closed fracture: Secondary | ICD-10-CM | POA: Diagnosis not present

## 2023-02-20 DIAGNOSIS — S52611D Displaced fracture of right ulna styloid process, subsequent encounter for closed fracture with routine healing: Secondary | ICD-10-CM | POA: Diagnosis not present

## 2023-07-28 ENCOUNTER — Other Ambulatory Visit: Payer: Self-pay

## 2023-07-28 ENCOUNTER — Emergency Department
Admission: EM | Admit: 2023-07-28 | Discharge: 2023-07-28 | Disposition: A | Discharge status: Home / Self Care | Attending: Emergency Medicine | Admitting: Emergency Medicine

## 2023-07-28 ENCOUNTER — Encounter: Payer: Self-pay | Admitting: Emergency Medicine

## 2023-07-28 ENCOUNTER — Emergency Department

## 2023-07-28 DIAGNOSIS — R1084 Generalized abdominal pain: Secondary | ICD-10-CM

## 2023-07-28 DIAGNOSIS — K5900 Constipation, unspecified: Secondary | ICD-10-CM | POA: Diagnosis not present

## 2023-07-28 DIAGNOSIS — K529 Noninfective gastroenteritis and colitis, unspecified: Secondary | ICD-10-CM | POA: Diagnosis not present

## 2023-07-28 LAB — COMPREHENSIVE METABOLIC PANEL WITH GFR
ALT: 12 U/L (ref 0–44)
AST: 19 U/L (ref 15–41)
Albumin: 4.4 g/dL (ref 3.5–5.0)
Alkaline Phosphatase: 105 U/L (ref 38–126)
Anion gap: 11 (ref 5–15)
BUN: 10 mg/dL (ref 6–20)
CO2: 24 mmol/L (ref 22–32)
Calcium: 9.3 mg/dL (ref 8.9–10.3)
Chloride: 102 mmol/L (ref 98–111)
Creatinine, Ser: 0.87 mg/dL (ref 0.61–1.24)
GFR, Estimated: >60 mL/min (ref >60)
Glucose, Bld: 90 mg/dL (ref 70–99)
Potassium: 4 mmol/L (ref 3.5–5.1)
Sodium: 137 mmol/L (ref 135–145)
Total Bilirubin: 0.8 mg/dL (ref 0.0–1.2)
Total Protein: 7.7 g/dL (ref 6.5–8.1)

## 2023-07-28 LAB — URINALYSIS, ROUTINE W REFLEX MICROSCOPIC
Bilirubin Urine: NEGATIVE
Glucose, UA: NEGATIVE mg/dL
Hgb urine dipstick: NEGATIVE
Ketones, ur: 20 mg/dL — AB
Leukocytes,Ua: NEGATIVE
Nitrite: NEGATIVE
Protein, ur: NEGATIVE mg/dL
Specific Gravity, Urine: 1.024 (ref 1.005–1.030)
pH: 5 (ref 5.0–8.0)

## 2023-07-28 LAB — CBC
HCT: 47 % (ref 39.0–52.0)
Hemoglobin: 15.4 g/dL (ref 13.0–17.0)
MCH: 29.3 pg (ref 26.0–34.0)
MCHC: 32.8 g/dL (ref 30.0–36.0)
MCV: 89.5 fL (ref 80.0–100.0)
Platelets: 265 10*3/uL (ref 150–400)
RBC: 5.25 MIL/uL (ref 4.22–5.81)
RDW: 13.2 % (ref 11.5–15.5)
WBC: 8.4 10*3/uL (ref 4.0–10.5)
nRBC: 0 % (ref 0.0–0.2)

## 2023-07-28 LAB — LIPASE, BLOOD: Lipase: 45 U/L (ref 11–51)

## 2023-07-28 MED ORDER — IOHEXOL 300 MG/ML  SOLN
100.0000 mL | Freq: Once | INTRAMUSCULAR | Status: AC | PRN
  Administered 2023-07-28: 100 mL via INTRAVENOUS

## 2023-07-28 MED ORDER — KETOROLAC TROMETHAMINE 15 MG/ML IJ SOLN
15.0000 mg | Freq: Once | INTRAMUSCULAR | Status: AC
  Administered 2023-07-28: 15 mg via INTRAVENOUS
  Filled 2023-07-28: qty 1

## 2023-07-28 MED ORDER — POLYETHYLENE GLYCOL 3350 17 G PO PACK: 17.0000 g | PACK | Freq: Every day | ORAL | 0 refills | Status: AC

## 2023-07-28 NOTE — Discharge Instructions (Signed)
 Please follow up with your outpatient provider. It was a pleasure caring for you.

## 2023-07-28 NOTE — ED Provider Notes (Signed)
 [facility] Provider Note    Event Date/Time   First MD Initiated Contact with Patient 07/28/23 1052     (approximate)   History   Abdominal Pain   HPI  Travis Love is a 23 y.o. male who presents today with abdominal pain underneath his scar from his hernia surgery from 2 years ago.  He reports that his pain has been persistent for the past 5 days.  No nausea, vomiting, diarrhea.  No fevers or chills.  No flank pain.  No dysuria or hematuria.  No testicular pain/swelling.  No change in pain with eating food.  Patient Active Problem List   Diagnosis Date Noted   Loss of appetite 10/08/2021   Status post laparoscopic herniorrhaphy 04/04/2021   GERD (gastroesophageal reflux disease) 01/08/2015   Allergic rhinitis 01/08/2015   Hx of anaphylaxis 01/08/2015          Physical Exam   Triage Vital Signs: ED Triage Vitals  Encounter Vitals Group     BP 07/28/23 1028 119/80     Systolic BP Percentile --      Diastolic BP Percentile --      Pulse Rate 07/28/23 1028 86     Resp 07/28/23 1028 17     Temp 07/28/23 1028 97.8 F (36.6 C)     Temp Source 07/28/23 1028 Oral     SpO2 07/28/23 1028 100 %     Weight 07/28/23 1029 140 lb (63.5 kg)     Height 07/28/23 1029 5\' 9"  (1.753 m)     Head Circumference --      Peak Flow --      Pain Score 07/28/23 1029 8     Pain Loc --      Pain Education --      Exclude from Growth Chart --     Most recent vital signs: Vitals:   07/28/23 1028 07/28/23 1406  BP: 119/80   Pulse: 86 86  Resp: 17   Temp: 97.8 F (36.6 C) 98 F (36.7 C)  SpO2: 100% 100%    Physical Exam Vitals and nursing note reviewed.  Constitutional:      General: Awake and alert. No acute distress.    Appearance: Normal appearance. The patient is normal weight.  HENT:     Head: Normocephalic and atraumatic.     Mouth: Mucous membranes are moist.  Eyes:     General: PERRL. Normal EOMs        Right eye: No discharge.         Left eye: No discharge.     Conjunctiva/sclera: Conjunctivae normal.  Cardiovascular:     Rate and Rhythm: Normal rate and regular rhythm.     Pulses: Normal pulses.  Pulmonary:     Effort: Pulmonary effort is normal. No respiratory distress.     Breath sounds: Normal breath sounds.  Abdominal:     Abdomen is soft. There is mild epigastric abdominal tenderness. No rebound or guarding. No distention. Musculoskeletal:        General: No swelling. Normal range of motion.     Cervical back: Normal range of motion and neck supple.  Skin:    General: Skin is warm and dry.     Capillary Refill: Capillary refill takes less than 2 seconds.     Findings: No rash.  Neurological:     Mental Status: The patient is awake and alert.      ED Results / Procedures / Treatments  Labs (all labs ordered are listed, but only abnormal results are displayed) Labs Reviewed  URINALYSIS, ROUTINE W REFLEX MICROSCOPIC - Abnormal; Notable for the following components:      Result Value   Color, Urine YELLOW (*)    APPearance CLEAR (*)    Ketones, ur 20 (*)    All other components within normal limits  LIPASE, BLOOD  COMPREHENSIVE METABOLIC PANEL WITH GFR  CBC     EKG     RADIOLOGY I independently reviewed and interpreted imaging and agree with radiologists findings.     PROCEDURES:  Critical Care performed:   Procedures   MEDICATIONS ORDERED IN ED: Medications  iohexol  (OMNIPAQUE ) 300 MG/ML solution 100 mL (100 mLs Intravenous Contrast Given 07/28/23 1208)  ketorolac  (TORADOL ) 15 MG/ML injection 15 mg (15 mg Intravenous Given 07/28/23 1235)     IMPRESSION / MDM / ASSESSMENT AND PLAN / ED COURSE  I reviewed the triage vital signs and the nursing notes.   Differential diagnosis includes, but is not limited to, recurrent hernia, pancreatitis, gastritis, appendicitis, diverticulitis.  Patient is awake and alert, hemodynamically stable and afebrile.  He was treated with Toradol  with  good effect.  Labs obtained are overall reassuring.  CAT scan obtained is overall unremarkable with the exception of moderate amount of residual fecal material throughout the colon without obstruction.  He was given a prescription for MiraLAX .  Upon reevaluation he reports that he feels improved.  He is ready for discharge home.  We discussed return precautions and outpatient follow-up.  Patient understands and agrees with plan.  Discharged in stable condition.  Patient's presentation is most consistent with acute complicated illness / injury requiring diagnostic workup.      FINAL CLINICAL IMPRESSION(S) / ED DIAGNOSES   Final diagnoses:  Generalized abdominal pain  Constipation, unspecified constipation type     Rx / DC Orders   ED Discharge Orders          Ordered    polyethylene glycol (MIRALAX ) 17 g packet  Daily        07/28/23 1402             Note:  This document was prepared using Dragon voice recognition software and may include unintentional dictation errors.   Alper Guilmette E, PA-C 07/28/23 1649    Viviano Ground, MD 08/03/23 1028

## 2023-07-28 NOTE — ED Triage Notes (Signed)
 Patient to ED via POV for mid abd pain. Ongoing x5 days. States pain is where he previously had a hernia removed. Denies N/V/D.

## 2024-03-15 ENCOUNTER — Other Ambulatory Visit: Payer: Self-pay

## 2024-03-15 ENCOUNTER — Emergency Department

## 2024-03-15 ENCOUNTER — Emergency Department
Admission: EM | Admit: 2024-03-15 | Discharge: 2024-03-15 | Disposition: A | Discharge status: Home / Self Care | Attending: Emergency Medicine | Admitting: Emergency Medicine

## 2024-03-15 DIAGNOSIS — S61412A Laceration without foreign body of left hand, initial encounter: Secondary | ICD-10-CM | POA: Diagnosis present

## 2024-03-15 DIAGNOSIS — Z23 Encounter for immunization: Secondary | ICD-10-CM | POA: Insufficient documentation

## 2024-03-15 DIAGNOSIS — W293XXA Contact with powered garden and outdoor hand tools and machinery, initial encounter: Secondary | ICD-10-CM | POA: Diagnosis not present

## 2024-03-15 MED ORDER — CEPHALEXIN 500 MG PO CAPS: 500.0000 mg | ORAL_CAPSULE | Freq: Three times a day (TID) | ORAL | 0 refills | Status: AC

## 2024-03-15 MED ORDER — TETANUS-DIPHTH-ACELL PERTUSSIS 5-2-15.5 LF-MCG/0.5 IM SUSP
0.5000 mL | Freq: Once | INTRAMUSCULAR | Status: AC
  Administered 2024-03-15: 0.5 mL via INTRAMUSCULAR

## 2024-03-15 MED ORDER — TRAMADOL HCL 50 MG PO TABS: 50.0000 mg | ORAL_TABLET | Freq: Four times a day (QID) | ORAL | 0 refills | Status: AC | PRN

## 2024-03-15 MED ORDER — LIDOCAINE HCL (PF) 1 % IJ SOLN
5.0000 mL | Freq: Once | INTRAMUSCULAR | Status: AC
  Administered 2024-03-15: 5 mL via INTRADERMAL
  Filled 2024-03-15: qty 5

## 2024-03-15 MED ORDER — OXYCODONE-ACETAMINOPHEN 5-325 MG PO TABS
1.0000 | ORAL_TABLET | Freq: Once | ORAL | Status: AC
  Administered 2024-03-15: 1 via ORAL
  Filled 2024-03-15: qty 1

## 2024-03-15 NOTE — ED Triage Notes (Signed)
 Pt to ED for cut to left hand from chainsaw. Bleeding only controlled with pressure. Deep lacerations noted

## 2024-03-15 NOTE — ED Provider Notes (Signed)
 "  [facility] Provider Note    Event Date/Time   First MD Initiated Contact with Patient 03/15/24 1129     (approximate)   History   Laceration   HPI  Travis Love is a 24 y.o. male with no significant past medical history presents emergency department complaint of laceration to the left hand.  Patient is left-handed.  He was using a chainsaw when he tripped and fell onto the sole.  Has lacerations on the hand.  States he can still move his fingers but they feel numb.  Unsure of his last Tdap      Physical Exam   Triage Vital Signs: ED Triage Vitals  Encounter Vitals Group     BP 03/15/24 1110 (!) 139/102     Girls Systolic BP Percentile --      Girls Diastolic BP Percentile --      Boys Systolic BP Percentile --      Boys Diastolic BP Percentile --      Pulse Rate 03/15/24 1110 93     Resp 03/15/24 1110 20     Temp 03/15/24 1110 97.9 F (36.6 C)     Temp src --      SpO2 03/15/24 1110 96 %     Weight 03/15/24 1116 145 lb (65.8 kg)     Height 03/15/24 1116 5' 9 (1.753 m)     Head Circumference --      Peak Flow --      Pain Score 03/15/24 1116 10     Pain Loc --      Pain Education --      Exclude from Growth Chart --     Most recent vital signs: Vitals:   03/15/24 1110  BP: (!) 139/102  Pulse: 93  Resp: 20  Temp: 97.9 F (36.6 C)  SpO2: 96%     General: Awake, no distress.   CV:  Good peripheral perfusion. Resp:  Normal effort.  Abd:  No distention.   Other:  Left hand laceration noted to the edge of the fifth metacarpal midshaft, laceration on the finger, no foreign body noted   ED Results / Procedures / Treatments   Labs (all labs ordered are listed, but only abnormal results are displayed) Labs Reviewed - No data to display   EKG     RADIOLOGY X-ray left hand    PROCEDURES:   .Laceration Repair  Date/Time: 03/15/2024 1:19 PM  Performed by: Gasper Devere ORN, PA-C Authorized by: Gasper Devere ORN,  PA-C   Consent:    Consent obtained:  Verbal   Consent given by:  Patient   Risks, benefits, and alternatives were discussed: yes     Risks discussed:  Infection, pain, retained foreign body, tendon damage, poor cosmetic result, need for additional repair, nerve damage, poor wound healing and vascular damage   Alternatives discussed:  No treatment Universal protocol:    Procedure explained and questions answered to patient or proxy's satisfaction: yes     Immediately prior to procedure, a time out was called: yes     Patient identity confirmed:  Verbally with patient Anesthesia:    Anesthesia method:  Local infiltration and nerve block   Local anesthetic:  Lidocaine  1% w/o epi   Block location:  Left ring finger   Block needle gauge:  27 G   Block anesthetic:  Lidocaine  1% w/o epi   Block technique:  Digital   Block injection procedure:  Anatomic landmarks identified,  introduced needle, incremental injection, anatomic landmarks palpated and negative aspiration for blood   Block outcome:  Anesthesia achieved Laceration details:    Location:  Finger   Finger location:  L ring finger   Length (cm):  2 Pre-procedure details:    Preparation:  Patient was prepped and draped in usual sterile fashion and imaging obtained to evaluate for foreign bodies Exploration:    Limited defect created (wound extended): no     Hemostasis achieved with:  Direct pressure   Imaging obtained: x-ray     Imaging outcome: foreign body not noted     Wound exploration: wound explored through full range of motion and entire depth of wound visualized     Wound extent: areolar tissue not violated, fascia not violated, no foreign body, no signs of injury, no nerve damage, no tendon damage, no underlying fracture and no vascular damage     Contaminated: no   Treatment:    Area cleansed with:  Povidone-iodine and saline   Amount of cleaning:  Standard   Irrigation solution:  Sterile saline   Irrigation method:   Tap and syringe   Debridement:  None   Undermining:  None   Scar revision: no   Skin repair:    Repair method:  Sutures   Suture size:  5-0   Suture material:  Nylon   Suture technique:  Simple interrupted   Number of sutures:  2 Approximation:    Approximation:  Close Repair type:    Repair type:  Simple Post-procedure details:    Dressing:  Non-adherent dressing   Procedure completion:  Tolerated well, no immediate complications .Laceration Repair  Date/Time: 03/15/2024 1:21 PM  Performed by: Gasper Devere ORN, PA-C Authorized by: Gasper Devere ORN, PA-C   Consent:    Consent obtained:  Verbal   Consent given by:  Patient   Risks, benefits, and alternatives were discussed: yes     Risks discussed:  Infection, pain, retained foreign body, tendon damage, poor cosmetic result, need for additional repair, nerve damage, poor wound healing and vascular damage   Alternatives discussed:  No treatment Universal protocol:    Procedure explained and questions answered to patient or proxy's satisfaction: yes     Immediately prior to procedure, a time out was called: yes     Patient identity confirmed:  Verbally with patient Anesthesia:    Anesthesia method:  Local infiltration   Local anesthetic:  Lidocaine  1% w/o epi Laceration details:    Location:  Hand   Hand location:  L palm   Length (cm):  5 Pre-procedure details:    Preparation:  Patient was prepped and draped in usual sterile fashion and imaging obtained to evaluate for foreign bodies Exploration:    Limited defect created (wound extended): no     Hemostasis achieved with:  Direct pressure   Imaging obtained: x-ray     Imaging outcome: foreign body not noted     Wound exploration: wound explored through full range of motion and entire depth of wound visualized     Wound extent: areolar tissue not violated, fascia not violated, no foreign body, no signs of injury, no nerve damage, no tendon damage, no underlying fracture and  no vascular damage     Contaminated: no   Treatment:    Area cleansed with:  Povidone-iodine and saline   Amount of cleaning:  Standard   Irrigation solution:  Sterile saline   Irrigation method:  Syringe and tap   Debridement:  None   Undermining:  None   Scar revision: no   Skin repair:    Repair method:  Sutures   Suture size:  5-0   Suture material:  Nylon   Suture technique:  Simple interrupted   Number of sutures:  11 Approximation:    Approximation:  Close Repair type:    Repair type:  Simple Post-procedure details:    Dressing:  Non-adherent dressing   Procedure completion:  Tolerated well, no immediate complications   Critical Care:  no Chief Complaint  Patient presents with   Laceration      MEDICATIONS ORDERED IN ED: Medications  lidocaine  (PF) (XYLOCAINE ) 1 % injection 5 mL (has no administration in time range)  lidocaine  (PF) (XYLOCAINE ) 1 % injection 5 mL (has no administration in time range)  oxyCODONE -acetaminophen  (PERCOCET/ROXICET) 5-325 MG per tablet 1 tablet (1 tablet Oral Given 03/15/24 1211)  Tdap (ADACEL) injection 0.5 mL (0.5 mLs Intramuscular Given 03/15/24 1212)     IMPRESSION / MDM / ASSESSMENT AND PLAN / ED COURSE  I reviewed the triage vital signs and the nursing notes.                              Differential diagnosis includes, but is not limited to, laceration, fracture, contusion, tendon injury, ligament injury, nerve injury  Patient's presentation is most consistent with acute illness / injury with system symptoms.   Medications given: Tdap, Xylocaine  for local, Percocet p.o. for pain  X-ray left hand dependent review interpretation by me as being negative for acute abnormality, specifically no foreign body or fracture  I did explain findings to patient.  See procedure note for laceration repair.  Since it was chainsaw and could have fragments that are not able to see with the neck and I did place him on Keflex  500 3 times daily.   I encouraged him follow-up with hand specialist, he would like to go to [facility] as he has seen them before.  Sutures should be removed in 7 to 10 days.  He is in agreement treatment plan.  Suture care discussed.  Discharged stable condition.      FINAL CLINICAL IMPRESSION(S) / ED DIAGNOSES   Final diagnoses:  Laceration of left hand without foreign body, initial encounter     Rx / DC Orders   ED Discharge Orders          Ordered    cephALEXin  (KEFLEX ) 500 MG capsule  3 times daily        03/15/24 1312    traMADol  (ULTRAM ) 50 MG tablet  Every 6 hours PRN        03/15/24 1314             Note:  This document was prepared using Dragon voice recognition software and may include unintentional dictation errors.    Gasper Devere ORN, PA-C 03/15/24 1324  "

## 2024-03-15 NOTE — Discharge Instructions (Signed)
 Consult [facility] for a wound check to occur this week. Keep the wound is clean and dry as possible.  In 24 hours you can wash the area with soap and water.  Apply another dressing if the area is still draining.  Keep the area covered when outside Return emergency department any sign of infection. Sutures should be removed in 7 to 10 days.
